# Patient Record
Sex: Male | Born: 1950 | Race: White | Hispanic: No | Marital: Married | Smoking: Former smoker
Health system: Southern US, Community
[De-identification: ages and names within clinical notes are randomized; demographics above are authoritative.]

## PROBLEM LIST (undated history)

## (undated) DIAGNOSIS — C801 Malignant (primary) neoplasm, unspecified: Secondary | ICD-10-CM

## (undated) DIAGNOSIS — I10 Essential (primary) hypertension: Secondary | ICD-10-CM

## (undated) DIAGNOSIS — M549 Dorsalgia, unspecified: Secondary | ICD-10-CM

## (undated) HISTORY — PX: BACK SURGERY: SHX140

---

## 1978-09-17 DIAGNOSIS — C801 Malignant (primary) neoplasm, unspecified: Secondary | ICD-10-CM

## 1978-09-17 HISTORY — DX: Malignant (primary) neoplasm, unspecified: C80.1

## 2017-01-15 ENCOUNTER — Encounter (HOSPITAL_COMMUNITY): Payer: Self-pay | Admitting: Emergency Medicine

## 2017-01-15 ENCOUNTER — Inpatient Hospital Stay (HOSPITAL_COMMUNITY)
Admission: EM | Admit: 2017-01-15 | Discharge: 2017-01-16 | DRG: 066 | Disposition: A | Discharge status: Home / Self Care | Payer: Medicare Other | Attending: Internal Medicine | Admitting: Internal Medicine

## 2017-01-15 ENCOUNTER — Inpatient Hospital Stay (HOSPITAL_COMMUNITY): Payer: Medicare Other

## 2017-01-15 ENCOUNTER — Emergency Department (HOSPITAL_COMMUNITY): Payer: Medicare Other

## 2017-01-15 DIAGNOSIS — Z8551 Personal history of malignant neoplasm of bladder: Secondary | ICD-10-CM | POA: Diagnosis not present

## 2017-01-15 DIAGNOSIS — I10 Essential (primary) hypertension: Secondary | ICD-10-CM | POA: Diagnosis present

## 2017-01-15 DIAGNOSIS — R29810 Facial weakness: Secondary | ICD-10-CM | POA: Diagnosis present

## 2017-01-15 DIAGNOSIS — Z806 Family history of leukemia: Secondary | ICD-10-CM | POA: Diagnosis not present

## 2017-01-15 DIAGNOSIS — Z7982 Long term (current) use of aspirin: Secondary | ICD-10-CM | POA: Diagnosis not present

## 2017-01-15 DIAGNOSIS — Z87891 Personal history of nicotine dependence: Secondary | ICD-10-CM | POA: Diagnosis not present

## 2017-01-15 DIAGNOSIS — E785 Hyperlipidemia, unspecified: Secondary | ICD-10-CM | POA: Diagnosis present

## 2017-01-15 DIAGNOSIS — R531 Weakness: Secondary | ICD-10-CM

## 2017-01-15 DIAGNOSIS — H532 Diplopia: Secondary | ICD-10-CM | POA: Diagnosis present

## 2017-01-15 DIAGNOSIS — D696 Thrombocytopenia, unspecified: Secondary | ICD-10-CM | POA: Diagnosis present

## 2017-01-15 DIAGNOSIS — R4781 Slurred speech: Secondary | ICD-10-CM

## 2017-01-15 DIAGNOSIS — G8929 Other chronic pain: Secondary | ICD-10-CM | POA: Diagnosis present

## 2017-01-15 DIAGNOSIS — G459 Transient cerebral ischemic attack, unspecified: Secondary | ICD-10-CM

## 2017-01-15 DIAGNOSIS — H4901 Third [oculomotor] nerve palsy, right eye: Secondary | ICD-10-CM | POA: Diagnosis present

## 2017-01-15 DIAGNOSIS — I639 Cerebral infarction, unspecified: Principal | ICD-10-CM

## 2017-01-15 DIAGNOSIS — G894 Chronic pain syndrome: Secondary | ICD-10-CM

## 2017-01-15 DIAGNOSIS — M545 Low back pain: Secondary | ICD-10-CM | POA: Diagnosis present

## 2017-01-15 DIAGNOSIS — Z6829 Body mass index (BMI) 29.0-29.9, adult: Secondary | ICD-10-CM

## 2017-01-15 DIAGNOSIS — Z823 Family history of stroke: Secondary | ICD-10-CM

## 2017-01-15 DIAGNOSIS — E669 Obesity, unspecified: Secondary | ICD-10-CM | POA: Diagnosis present

## 2017-01-15 DIAGNOSIS — Z801 Family history of malignant neoplasm of trachea, bronchus and lung: Secondary | ICD-10-CM

## 2017-01-15 DIAGNOSIS — D571 Sickle-cell disease without crisis: Secondary | ICD-10-CM

## 2017-01-15 HISTORY — DX: Malignant (primary) neoplasm, unspecified: C80.1

## 2017-01-15 HISTORY — DX: Dorsalgia, unspecified: M54.9

## 2017-01-15 HISTORY — DX: Essential (primary) hypertension: I10

## 2017-01-15 LAB — I-STAT CHEM 8, ED
BUN: 10 mg/dL (ref 6–20)
CREATININE: 1 mg/dL (ref 0.61–1.24)
Calcium, Ion: 1.09 mmol/L — ABNORMAL LOW (ref 1.15–1.40)
Chloride: 103 mmol/L (ref 101–111)
GLUCOSE: 83 mg/dL (ref 65–99)
HCT: 41 % (ref 39.0–52.0)
HEMOGLOBIN: 13.9 g/dL (ref 13.0–17.0)
Potassium: 4 mmol/L (ref 3.5–5.1)
Sodium: 140 mmol/L (ref 135–145)
TCO2: 29 mmol/L (ref 0–100)

## 2017-01-15 LAB — CBC
HEMATOCRIT: 39.5 % (ref 39.0–52.0)
HEMOGLOBIN: 12.8 g/dL — AB (ref 13.0–17.0)
MCH: 22.4 pg — AB (ref 26.0–34.0)
MCHC: 32.4 g/dL (ref 30.0–36.0)
MCV: 69.2 fL — AB (ref 78.0–100.0)
Platelets: 131 10*3/uL — ABNORMAL LOW (ref 150–400)
RBC: 5.71 MIL/uL (ref 4.22–5.81)
RDW: 15.7 % — AB (ref 11.5–15.5)
WBC: 6 10*3/uL (ref 4.0–10.5)

## 2017-01-15 LAB — DIFFERENTIAL
BASOS PCT: 0 %
Basophils Absolute: 0 10*3/uL (ref 0.0–0.1)
EOS PCT: 2 %
Eosinophils Absolute: 0.1 10*3/uL (ref 0.0–0.7)
Lymphocytes Relative: 28 %
Lymphs Abs: 1.7 10*3/uL (ref 0.7–4.0)
MONO ABS: 0.4 10*3/uL (ref 0.1–1.0)
Monocytes Relative: 7 %
Neutro Abs: 3.8 10*3/uL (ref 1.7–7.7)
Neutrophils Relative %: 63 %

## 2017-01-15 LAB — I-STAT TROPONIN, ED: TROPONIN I, POC: 0 ng/mL (ref 0.00–0.08)

## 2017-01-15 LAB — COMPREHENSIVE METABOLIC PANEL
ALT: 19 U/L (ref 17–63)
AST: 24 U/L (ref 15–41)
Albumin: 3.8 g/dL (ref 3.5–5.0)
Alkaline Phosphatase: 41 U/L (ref 38–126)
Anion gap: 9 (ref 5–15)
BILIRUBIN TOTAL: 1.3 mg/dL — AB (ref 0.3–1.2)
BUN: 9 mg/dL (ref 6–20)
CO2: 24 mmol/L (ref 22–32)
CREATININE: 1.05 mg/dL (ref 0.61–1.24)
Calcium: 9.1 mg/dL (ref 8.9–10.3)
Chloride: 106 mmol/L (ref 101–111)
GFR calc Af Amer: >60 mL/min (ref >60)
Glucose, Bld: 81 mg/dL (ref 65–99)
POTASSIUM: 3.6 mmol/L (ref 3.5–5.1)
Sodium: 139 mmol/L (ref 135–145)
TOTAL PROTEIN: 6.5 g/dL (ref 6.5–8.1)

## 2017-01-15 LAB — APTT: aPTT: 27 seconds (ref 24–36)

## 2017-01-15 LAB — PROTIME-INR
INR: 1.04
Prothrombin Time: 13.6 seconds (ref 11.4–15.2)

## 2017-01-15 MED ORDER — ASPIRIN 81 MG PO CHEW: 324.0000 mg | CHEWABLE_TABLET | Freq: Once | ORAL | Status: DC

## 2017-01-15 MED ORDER — ASPIRIN 300 MG RE SUPP
300.0000 mg | Freq: Every day | RECTAL | Status: DC
  Administered 2017-01-15: 300 mg via RECTAL
  Filled 2017-01-15: qty 1

## 2017-01-15 MED ORDER — ALPRAZOLAM 0.25 MG PO TABS
0.2500 mg | ORAL_TABLET | Freq: Every day | ORAL | Status: DC
  Administered 2017-01-15: 0.25 mg via ORAL
  Filled 2017-01-15: qty 1

## 2017-01-15 MED ORDER — GADOBENATE DIMEGLUMINE 529 MG/ML IV SOLN
20.0000 mL | Freq: Once | INTRAVENOUS | Status: AC
  Administered 2017-01-15: 20 mL via INTRAVENOUS

## 2017-01-15 MED ORDER — TEMAZEPAM 7.5 MG PO CAPS
30.0000 mg | ORAL_CAPSULE | Freq: Every day | ORAL | Status: DC
  Administered 2017-01-15: 30 mg via ORAL
  Filled 2017-01-15: qty 4

## 2017-01-15 MED ORDER — OXYCODONE HCL 5 MG PO TABS
30.0000 mg | ORAL_TABLET | Freq: Four times a day (QID) | ORAL | Status: DC | PRN
  Administered 2017-01-16 (×2): 30 mg via ORAL
  Filled 2017-01-15 (×2): qty 6

## 2017-01-15 MED ORDER — ASPIRIN 325 MG PO TABS
325.0000 mg | ORAL_TABLET | Freq: Every day | ORAL | Status: DC
  Administered 2017-01-16: 325 mg via ORAL
  Filled 2017-01-15: qty 1

## 2017-01-15 MED ORDER — STROKE: EARLY STAGES OF RECOVERY BOOK
Freq: Once | Status: AC
  Administered 2017-01-16: 06:00:00
  Filled 2017-01-15: qty 1

## 2017-01-15 MED ORDER — OXYCODONE HCL ER 20 MG PO T12A
40.0000 mg | EXTENDED_RELEASE_TABLET | Freq: Two times a day (BID) | ORAL | Status: DC
  Administered 2017-01-15 – 2017-01-16 (×2): 40 mg via ORAL
  Filled 2017-01-15 (×2): qty 2

## 2017-01-15 MED ORDER — ESCITALOPRAM OXALATE 10 MG PO TABS
20.0000 mg | ORAL_TABLET | Freq: Two times a day (BID) | ORAL | Status: DC
Start: 2017-01-15 — End: 2017-01-16
  Administered 2017-01-15 – 2017-01-16 (×2): 20 mg via ORAL
  Filled 2017-01-15 (×2): qty 2

## 2017-01-15 NOTE — ED Triage Notes (Signed)
Pt arrived via [facility] c/o slurred speech, unsteady gait, R eye closed, L sided facial droop, per pt's spouse LSW 1600.  EMS reports slurred speech intermittent.  No unilateral weakness noted.  Pt AOx4,

## 2017-01-15 NOTE — ED Notes (Addendum)
Patient transported to MRI 

## 2017-01-15 NOTE — Consult Note (Signed)
Neurology Consult Note  Reason for Consultation: CODE STROKE  Requesting provider: Duffy Bruce, MD  CC: Double vision  HPI: This is a 68-yo RH man who presents to the [facility] ED after experiencing the acute onset of double vision this afternoon. History is obtained directly from the patient who is a good historian. His wife is present at the bedside and offers additional information as needed.   They report that they are visiting from out of town and have been in town for about the past week. Today they were in their hotel room preparing for their flight home tomorrow morning. At 1615, he experienced the abrupt onset of double vision. This resolves if he closes either eye. This was accompanied by some left facial droop. His wife reports that she initially thought that he was playing around because he would look at her with both eyes closed, and looked as if he was having difficulty lifting his eyelids. However, when symptoms persisted, she called 911. He was transported to the [facility] emergency department where a code stroke was activated after his arrival. I arrived to evaluate the patient in the [place]. At that time, he had evidence of a partial right third nerve palsy and a left facial droop, no other deficits. NIH stroke scale score was 2. He did not have any deficits involving his extremities. He reported resolution of his double vision while he was in the emergency room. Because of his minor deficits, the decision was made not to proceed with thrombolytic therapy.  The patient denies any associated headache. He has some chronic numbness and tingling in his right leg but says this is unchanged today. He has no symptoms in his arms or left leg. He denies any difficulty with walking. He has not had any difficulty talking or swallowing. He denies any numbness or tingling in his face. He feels like he has blurry vision as well as double vision. No vision loss, however. He denies vertigo,  nausea, or vomiting. He apparently experienced a fall about one week ago, no reported trauma with this fall.  His wife reports that he typically takes aspirin 81 mg daily. However, he has not been taking this for the past week while they have been here on vacation. Additionally, he takes Diovan for his blood pressure. However, he did not bring that with him so he has been taking a different medication which he takes for palpitations because his doctor told him that would also help to lower his blood pressure. She is unable to recall the name of the medication. He takes oxycodone for chronic back pain. He also has Xanax but says he does not like to take it because it makes him sleepy. He denies taking any excessive medications today.  Last known well: 1615 today NIHSS score: 2 mRS score: 0 tPA given?: No, minor deficits that have been improving   PMH:  1. Hypertension . Palpitations 3. Chronic low back pain 4. Chronic numbness right leg  PSH:  He reports prior history of back surgery.  Family history: Noncontributory  Social history: He is married and lives with his wife in [place]. They're visiting the area. He denies any tobacco he is having stop smoking in 1980. He drinks about 7 beers per week, no reported heavy use. He denies illicit drug use.  Allergies: None reported  ROS: As per HPI. A full 14-point review of systems was performed and is otherwise unremarkable.   PE:  BP (!) 161/67 (  BP Location: Left Arm)   Pulse (!) 53   Temp 98 F (36.7 C) (Oral)   Resp 10   Ht 5\' 11"  (1.803 m)   Wt 93.3 kg (205 lb 11 oz)   SpO2 94%   BMI 28.69 kg/m   General: WDWN, no acute distress. AAO x4. Speech notable for mild intermittent dysarthria. No aphasia. Follows commands briskly. Affect is bright with congruent mood. Comportment is normal.  HEENT: Normocephalic. Neck supple without LAD. MMM, OP clear. Dentition good. Sclerae anicteric. No conjunctival injection.  CV: Regular, no  murmur. Carotid pulses full and symmetric, no bruits. Distal pulses 2+ and symmetric.  Lungs: CTAB.  Abdomen: Soft, obese, non-distended, non-tender. Bowel sounds present x4.  Extremities: No C/C/E. Neuro:  CN: Pupils are equal and round. They are symmetrically reactive from 2-->1 mm. visual fields are full to confrontation. The right eye is deviated laterally and inferiorly at rest. He has poor movement of the right eye medially and vertically. He tends to keep both eyes closed, particularly the right one, but with encouragement he opens them symmetrically without any obvious ptosis. He initially reported binocular diplopia that this resolved on serial examinations. Facial sensation is intact to light touch. Face is notable for some drooping of the left side of the mouth, suggestive of a left central VII pattern of weakness. Hearing is intact to conversational voice. Palate elevates symmetrically and uvula is midline. Voice is normal in tone, pitch and quality. Bilateral SCM and trapezii are 5/5. Tongue is midline with normal bulk and mobility.  Motor: Normal bulk, tone, and strength. No tremor or other abnormal movements. No drift.  Sensation: Intact to light touch with the exception of some dysesthesias in the right leg which she reports are chronic. DTRs: 3+, symmetric. Toes downgoing bilaterally. 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 Coordination: Finger-to-nose and heel-to-shin are without dysmetria. Finger taps are normal in amplitude and speed, no decrement.    Labs:  Lab Results  Component Value Date   HGB 13.9 01/15/2017   HCT 41.0 01/15/2017   GLUCOSE 83 01/15/2017   NA 140 01/15/2017   K 4.0 01/15/2017   CL 103 01/15/2017   CREATININE 1.00 01/15/2017   BUN 10 01/15/2017   Troponin 0.00 CBC is notable for hemoglobin of 12.8, hematocrit 39.5, platelets 131K PTT 27 PT 13.6, INR 1.04  Imaging:  I have personally and independently reviewed the CT scan of the head without contrast  from today. I have also reviewed the interpreting radiologist's report and concur. This shows no obvious acute abnormality. There appears to be mild-to-moderate diffuse generalized atrophy. A mild burden of chronic small vessel ischemic changes seen in the bihemispheric white matter. Visualized intracranial vessels appear unremarkable with no evident hyperdensities.  Assessment and Plan:  1. Acute ischemic stroke: His presentation is concerning for possible stroke involving the right midbrain. Symptoms seem to be improving somewhat and the decision was made not to proceed with thrombolytic therapy. He will be admitted for further workup, including MRI brain, MRA of the head and neck, TTE, fasting lipids, and hemoglobin a1c. Further testing will be determined by results from these initial studies. Recommend antiplatelet therapy with aspirin 325 mg daily for secondary stroke prevention once cleared to take oral medications. He may benefit from the addition of a statin with goal LDL less than 70. Ensure adequate glucose control. Allow permissive hypertension in the acute phase, treating only SBP greater than 220 mmHg and/or DBP greater than 110 mmHg. Avoid fever and hyperglycemia  as these can extend the infarct and have been associated with forced neurological outcomes. Initiate rehab services. DVT prophylaxis as needed.   2. Right cranial nerve III palsy: This is incomplete and spares the pupil. This is acute, concerning for midbrain stroke as noted above. If diplopia recurs, recommend alternating eye patching for symptomatic treatment. This will likely be self-limited and anticipate improvement in eye movements over time. PT/OT as needed.  3. Facial droop: He appears to have a left facial droop, again concerning for midbrain stroke. This is mild. Follow.  4. Dysarthria: This is acute and mild, concerning for midbrain stroke as noted above. Recommend swallow evaluation before allowing oral intake.  This was  discussed with the patient and his wife. Education was provided on the diagnosis and expected evaluation and treatment. I have recommended admission and advised that they rescheduled there flights as he is not medically able to fly home tomorrow morning. They are in agreement with the plan as noted. They were given the opportunity to ask any questions and these were addressed to their satisfaction.   Thank you for this consultation. Neurology will continue to follow. The stroke team will assume care of the patient beginning 01/16/17. Please call with any questions or concerns.

## 2017-01-15 NOTE — ED Provider Notes (Signed)
[facility] DEPT Provider Note   CSN: [id number] Arrival date & time: 01/15/17  1733     History   Chief Complaint No chief complaint on file.   HPI Jaliel Deavers is a 66 y.o. male.  The history is provided by the patient and the EMS personnel.  Cerebrovascular Accident  This is a new problem. Episode onset: 1600. The problem occurs constantly. The problem has been gradually improving. Pertinent negatives include no chest pain, no abdominal pain, no headaches and no shortness of breath. Nothing aggravates the symptoms. Nothing relieves the symptoms. He has tried nothing for the symptoms.    No past medical history on file.  There are no active problems to display for this patient.   No past surgical history on file.   Home Medications    Prior to Admission medications   Not on File    Family History No family history on file.  Social History Social History  Substance Use Topics  . Smoking status: Not on file  . Smokeless tobacco: Not on file  . Alcohol use Not on file     Allergies   Patient has no allergy information on record.   Review of Systems Review of Systems  Constitutional: Negative for chills and fever.  HENT: Negative for ear pain and sore throat.   Eyes: Negative for pain and visual disturbance.  Respiratory: Negative for cough and shortness of breath.   Cardiovascular: Negative for chest pain and palpitations.  Gastrointestinal: Negative for abdominal pain and vomiting.  Genitourinary: Negative for dysuria and hematuria.  Musculoskeletal: Negative for arthralgias and back pain.  Skin: Negative for color change and rash.  Neurological: Positive for facial asymmetry, speech difficulty and weakness. Negative for seizures, syncope and headaches.  All other systems reviewed and are negative.    Physical Exam Updated Vital Signs Wt 93.3 kg   Physical Exam  Constitutional: He is oriented to person, place, and time. He appears  well-developed and well-nourished.  HENT:  Head: Normocephalic and atraumatic.  Eyes: Conjunctivae are normal.  Neck: Neck supple.  Cardiovascular: Regular rhythm.   No murmur heard. Pulmonary/Chest: Effort normal and breath sounds normal. No respiratory distress.  Abdominal: Soft. There is no tenderness.  Musculoskeletal: He exhibits no edema.  Neurological: He is alert and oriented to person, place, and time.  Left facial droop, mild slurring of speech, 4/5 grip strength in LUE, mild LUE drift, no focal sensory deficits, gait deferred  Skin: Skin is warm and dry.  Psychiatric: He has a normal mood and affect.  Nursing note and vitals reviewed.    ED Treatments / Results  Labs (all labs ordered are listed, but only abnormal results are displayed) Labs Reviewed  PROTIME-INR  APTT  CBC  DIFFERENTIAL  COMPREHENSIVE METABOLIC PANEL  I-STAT [place], ED  CBG MONITORING, ED  I-STAT CHEM 8, ED    EKG  EKG Interpretation None       Radiology No results found.  Procedures Procedures (including critical care time)  Medications Ordered in ED Medications - No data to display   Initial Impression / Assessment and Plan / ED Course  I have reviewed the triage vital signs and the nursing notes.  Pertinent labs & imaging results that were available during my care of the patient were reviewed by me and considered in my medical decision making (see chart for details).    Pt with presents with left facial droop, slurred speech, LUE weakness & tingling, and gait difficulty. LKN 1600;  Pt was driving and went on a hike with his wife this morning. CODE STROKE activated on arrival to the ED.  VS & exam as above. Labs & imaging ordered per protocol.  Neurology consulted and evaluated the Pt in the ED; "too good to treat" so recommending against tPA. Requesting admission to medicine for further evaluation and treatment.  Will admit the Pt to the Hospitalist's service for further  evaluation and management.  Final Clinical Impressions(s) / ED Diagnoses   Final diagnoses:  Left-sided weakness  Slurred speech    New Prescriptions New Prescriptions   No medications on file     Jenny Reichmann, MD 01/15/17 1829    Duffy Bruce, MD 01/17/17 1136

## 2017-01-15 NOTE — H&P (Signed)
History and Physical    Ihan Pat GYF:[id number] DOB: [birth year] DOA: 01/15/2017  PCP: Robert DelaGente - [facility] in [place], [place] Consultants:  Cardiology - Lorelei Pont; Cindee Lame - GI; Glade Stanford - neurosurgery Patient coming from: home in [place] -came to [place] for a family wedding; NOK: wife, [phone]   Chief Complaint: diplopia  HPI: Timothy Woods is a 66 y.o. male with medical history significant of remote bladder cancer, HTN, and chronic back pain presenting with concern for CVA.  He and his wife came to [facility] for a wedding last weekend; they arrived last Wednesday and were supposed to leave tomorrow.  This morning, they had breakfast and took a nice country drive to [facility].  Did not hike, just enjoyed the day and drove back.  Arrived back to their [facility], walked into the room and his eyes became asynchronous, unable to walk straight, slurred speech.  Acute onset of symptoms.  No facial droop according to wife, but EMS was concerned about R facial droop.  Has chronic right leg tingling due to back issues.  Noticed left arm and hand numbness and tingling.  Tongue and teeth were tingling.  Does not think that he had leg symptoms otherwise.  Symptoms occurred over about 10 minutes but they did not go away for >1 hour.  Developed left facial droop and right ptosis for a short period of time in the ER.  Symptoms recurred again after MRI.  At this time, symptoms appear to be completely resolved.  Of note, he forgot his Diovan/HCT and has not had it since last Wednesday.  He has been taking Metoprolol 1-2 tabs daily for palpitations and did not forget that.  He takes ASA most days at home but also forgot this on his trip and has not been taking it for almost a week.    With regards to his palpitations, he has had an echo and other evaluation for the problem and the cardiologist thought that it was stress-related.  He has not had any kind of prolonged monitoring  such as a Holter or event monitor.   ED Course: Code stroke activated, neurology saw the patient and did not recommend tPA  Review of Systems: As per HPI; otherwise review of systems reviewed and negative.   Ambulatory Status:  Ambulates without assistance  Past Medical History:  Diagnosis Date  . Back pain    4 bulging disks  . Cancer ([place]) 1980   bladder  . Hypertension     Past Surgical History:  Procedure Laterality Date  . BACK SURGERY      Social History   Social History  . Marital status: N/A    Spouse name: N/A  . Number of children: N/A  . Years of education: N/A   Occupational History  . retired Insurance claims handler    Social History Main Topics  . Smoking status: Former Smoker    Years: 35.00    Quit date: 01/16/1979  . Smokeless tobacco: Never Used  . Alcohol use 4.2 oz/week    7 Cans of beer per week     Comment: occasional  . Drug use: No     Comment: remote h/o marijuana  . Sexual activity: Not on file   Other Topics Concern  . Not on file   Social History Narrative  . No narrative on file    No Known Allergies  Family History  Problem Relation Age of Onset  . Lung cancer Mother 39  .  Leukemia Father 64  . CVA Maternal Aunt     x2    Prior to Admission medications   Medication Sig Start Date End Date Taking? Authorizing Provider  ALPRAZolam (XANAX) 0.25 MG tablet Take 0.25 mg by mouth at bedtime.   Yes Historical Provider, MD  aspirin EC 81 MG tablet Take 81 mg by mouth daily.   Yes Historical Provider, MD  escitalopram (LEXAPRO) 20 MG tablet Take 20 mg by mouth 2 (two) times daily.   Yes Historical Provider, MD  metoprolol (LOPRESSOR) 50 MG tablet Take 50 mg by mouth 2 (two) times daily.   Yes Historical Provider, MD  oxyCODONE (OXYCONTIN) 40 mg 12 hr tablet Take 40 mg by mouth 2 (two) times daily.   Yes Historical Provider, MD  oxycodone (ROXICODONE) 30 MG immediate release tablet Take 30 mg by mouth every 6 (six) hours as needed for  pain.   Yes Historical Provider, MD  temazepam (RESTORIL) 30 MG capsule Take 30 mg by mouth at bedtime.   Yes Historical Provider, MD  valsartan-hydrochlorothiazide (DIOVAN-HCT) 80-12.5 MG tablet Take 1 tablet by mouth daily.   Yes Historical Provider, MD    Physical Exam: Vitals:   01/15/17 1945 01/15/17 2030 01/15/17 2044 01/15/17 2105  BP: 135/65 (!) 164/76  (!) 160/63  Pulse:  (!) 54  (!) 51  Resp: 14   16  Temp:   98.1 F (36.7 C) 98.1 F (36.7 C)  TempSrc:    Oral  SpO2: 98% 99%  99%  Weight:    94.7 kg (208 lb 11.2 oz)  Height:    5\' 11"  (1.803 m)     General:  Appears calm and comfortable and is NAD Eyes:  PERRL, EOMI, normal lids, iris; he intermittently closes her right eye due to intermittent diplopia.  As long as he is looking in one direction, he does not have difficulty.  But when he turns his head and looks away, the diplopia returns. ENT:  grossly normal hearing, lips & tongue, mmm Neck:  no LAD, masses or thyromegaly Cardiovascular:  RRR, no m/r/g. No LE edema.  Respiratory:  CTA bilaterally, no w/r/r. Normal respiratory effort. Abdomen:  soft, ntnd, NABS Skin:  no rash or induration seen on limited exam Musculoskeletal:  grossly normal tone BUE/BLE, good ROM, no bony abnormality Psychiatric:  grossly normal mood and affect, speech fluent and appropriate, AOx3 Neurologic:  CN 2-12 grossly intact, moves all extremities in coordinated fashion, sensation intact.  Mild chronic RLE numbness which is unchanged.  Labs on Admission: I have personally reviewed following labs and imaging studies  CBC:  Recent Labs Lab 01/15/17 1738 01/15/17 1743  WBC 6.0  --   NEUTROABS 3.8  --   HGB 12.8* 13.9  HCT 39.5 41.0  MCV 69.2*  --   PLT 131*  --    Basic Metabolic Panel:  Recent Labs Lab 01/15/17 1738 01/15/17 1743  NA 139 140  K 3.6 4.0  CL 106 103  CO2 24  --   GLUCOSE 81 83  BUN 9 10  CREATININE 1.05 1.00  CALCIUM 9.1  --    GFR: Estimated Creatinine  Clearance: 84.3 mL/min (by C-G formula based on SCr of 1 mg/dL). Liver Function Tests:  Recent Labs Lab 01/15/17 1738  AST 24  ALT 19  ALKPHOS 41  BILITOT 1.3*  PROT 6.5  ALBUMIN 3.8   No results for input(s): LIPASE, AMYLASE in the last 168 hours. No results for input(s): AMMONIA in  the last 168 hours. Coagulation Profile:  Recent Labs Lab 01/15/17 1738  INR 1.04   Cardiac Enzymes: No results for input(s): CKTOTAL, CKMB, CKMBINDEX, TROPONINI in the last 168 hours. BNP (last 3 results) No results for input(s): PROBNP in the last 8760 hours. HbA1C: No results for input(s): HGBA1C in the last 72 hours. CBG: No results for input(s): GLUCAP in the last 168 hours. Lipid Profile: No results for input(s): CHOL, HDL, LDLCALC, TRIG, CHOLHDL, LDLDIRECT in the last 72 hours. Thyroid Function Tests: No results for input(s): TSH, T4TOTAL, FREET4, T3FREE, THYROIDAB in the last 72 hours. Anemia Panel: No results for input(s): VITAMINB12, FOLATE, FERRITIN, TIBC, IRON, RETICCTPCT in the last 72 hours. Urine analysis: No results found for: COLORURINE, APPEARANCEUR, LABSPEC, PHURINE, GLUCOSEU, HGBUR, BILIRUBINUR, KETONESUR, PROTEINUR, UROBILINOGEN, NITRITE, LEUKOCYTESUR  Creatinine Clearance: Estimated Creatinine Clearance: 84.3 mL/min (by C-G formula based on SCr of 1 mg/dL).  Sepsis Labs: @LABRCNTIP (procalcitonin:4,lacticidven:4) )No results found for this or any previous visit (from the past 240 hour(s)).   Radiological Exams on Admission: Mr [name] Neck W Wo Contrast  Result Date: 01/15/2017 CLINICAL DATA:  66 y/o M; slurred speech, unsteady gait, right eye close, left facial droop. EXAM: MR HEAD WITHOUT CONTRAST MRA HEAD WITHOUT CONTRAST MRA OF THE NECK WITHOUT AND WITH CONTRAST TECHNIQUE: Multiplanar, multiecho pulse sequences of the brain and surrounding structures were obtained without intravenous contrast. Angiographic images of the head were obtained using MRA technique without  intravenous contrast. Angiographic images of the neck were obtained using MRA technique without and with intravenous contrast. CONTRAST:  75mL MULTIHANCE GADOBENATE DIMEGLUMINE 529 MG/ML IV SOLN COMPARISON:  01/15/2017 CT of the head. FINDINGS: MR HEAD FINDINGS Brain: 4 mm focus of reduced diffusion within the superior midbrain posterior and superior to the right red nucleus compatible with acute infarction. No acute intracranial hemorrhage. Scattered punctate foci of T2 FLAIR hyperintensity in subcortical and periventricular white matter compatible with mild chronic microvascular ischemic changes. Mild brain parenchymal volume loss. No extra-axial collection, hydrocephalus, or effacement of basilar cisterns. Prominent retrocerebellar extra-axial space probably represents an arachnoid cyst or possibly a mega cisterna magna. Vascular: As below. Skull and upper cervical spine: Normal marrow signal. Sinuses/Orbits: The trace right mastoid effusion. Otherwise no abnormal signal of the paranasal sinuses. Orbits are unremarkable. Other: None. MR CIRCLE OF WILLIS FINDINGS Internal carotid arteries:  Patent. Anterior cerebral arteries:  Patent. Middle cerebral arteries: Patent. Anterior communicating artery: Patent. Posterior communicating arteries:  Patent. Posterior cerebral arteries:  Patent. Basilar artery:  Patent. Vertebral arteries:  Patent. No evidence of high-grade stenosis, large vessel occlusion, or aneurysm. MRA NECK FINDINGS Aortic arch: Patent.  Standard branching. Right common carotid artery: Patent. Right internal carotid artery: Patent. Right vertebral artery: Patent. Left common carotid artery: Patent. Left Internal carotid artery: Patent. Kinking and moderate tortuosity of the proximal internal carotid artery. Left Vertebral artery: Patent. There is no evidence of high-grade stenosis, dissection, or aneurysm unless noted above. IMPRESSION: 1. 4 mm acute infarction within the right superior midbrain  centered posterior and superior to the red nucleus. 2. Background of mild chronic microvascular ischemic changes and mild parenchymal volume loss of the brain. 3. Patent circle of Willis. No large vessel occlusion, aneurysm, or significant stenosis is identified. 4. Patent carotid and vertebral arteries. No large vessel occlusion, aneurysm, or significant stenosis is identified. These results will be called to the ordering clinician or representative by the Radiologist Assistant, and communication documented in the PACS or zVision Dashboard. Electronically Signed   By:  Kristine Garbe M.D.   On: 01/15/2017 20:53   Mr Brain Wo Contrast  Result Date: 01/15/2017 CLINICAL DATA:  66 y/o M; slurred speech, unsteady gait, right eye close, left facial droop. EXAM: MR HEAD WITHOUT CONTRAST MRA HEAD WITHOUT CONTRAST MRA OF THE NECK WITHOUT AND WITH CONTRAST TECHNIQUE: Multiplanar, multiecho pulse sequences of the brain and surrounding structures were obtained without intravenous contrast. Angiographic images of the head were obtained using MRA technique without intravenous contrast. Angiographic images of the neck were obtained using MRA technique without and with intravenous contrast. CONTRAST:  88mL MULTIHANCE GADOBENATE DIMEGLUMINE 529 MG/ML IV SOLN COMPARISON:  01/15/2017 CT of the head. FINDINGS: MR HEAD FINDINGS Brain: 4 mm focus of reduced diffusion within the superior midbrain posterior and superior to the right red nucleus compatible with acute infarction. No acute intracranial hemorrhage. Scattered punctate foci of T2 FLAIR hyperintensity in subcortical and periventricular white matter compatible with mild chronic microvascular ischemic changes. Mild brain parenchymal volume loss. No extra-axial collection, hydrocephalus, or effacement of basilar cisterns. Prominent retrocerebellar extra-axial space probably represents an arachnoid cyst or possibly a mega cisterna magna. Vascular: As below. Skull and  upper cervical spine: Normal marrow signal. Sinuses/Orbits: The trace right mastoid effusion. Otherwise no abnormal signal of the paranasal sinuses. Orbits are unremarkable. Other: None. MR CIRCLE OF WILLIS FINDINGS Internal carotid arteries:  Patent. Anterior cerebral arteries:  Patent. Middle cerebral arteries: Patent. Anterior communicating artery: Patent. Posterior communicating arteries:  Patent. Posterior cerebral arteries:  Patent. Basilar artery:  Patent. Vertebral arteries:  Patent. No evidence of high-grade stenosis, large vessel occlusion, or aneurysm. MRA NECK FINDINGS Aortic arch: Patent.  Standard branching. Right common carotid artery: Patent. Right internal carotid artery: Patent. Right vertebral artery: Patent. Left common carotid artery: Patent. Left Internal carotid artery: Patent. Kinking and moderate tortuosity of the proximal internal carotid artery. Left Vertebral artery: Patent. There is no evidence of high-grade stenosis, dissection, or aneurysm unless noted above. IMPRESSION: 1. 4 mm acute infarction within the right superior midbrain centered posterior and superior to the red nucleus. 2. Background of mild chronic microvascular ischemic changes and mild parenchymal volume loss of the brain. 3. Patent circle of Willis. No large vessel occlusion, aneurysm, or significant stenosis is identified. 4. Patent carotid and vertebral arteries. No large vessel occlusion, aneurysm, or significant stenosis is identified. These results will be called to the ordering clinician or representative by the Radiologist Assistant, and communication documented in the PACS or zVision Dashboard. Electronically Signed   By: Kristine Garbe M.D.   On: 01/15/2017 20:53   Mr [name] Headm  Result Date: 01/15/2017 CLINICAL DATA:  67 y/o M; slurred speech, unsteady gait, right eye close, left facial droop. EXAM: MR HEAD WITHOUT CONTRAST MRA HEAD WITHOUT CONTRAST MRA OF THE NECK WITHOUT AND WITH CONTRAST  TECHNIQUE: Multiplanar, multiecho pulse sequences of the brain and surrounding structures were obtained without intravenous contrast. Angiographic images of the head were obtained using MRA technique without intravenous contrast. Angiographic images of the neck were obtained using MRA technique without and with intravenous contrast. CONTRAST:  82mL MULTIHANCE GADOBENATE DIMEGLUMINE 529 MG/ML IV SOLN COMPARISON:  01/15/2017 CT of the head. FINDINGS: MR HEAD FINDINGS Brain: 4 mm focus of reduced diffusion within the superior midbrain posterior and superior to the right red nucleus compatible with acute infarction. No acute intracranial hemorrhage. Scattered punctate foci of T2 FLAIR hyperintensity in subcortical and periventricular white matter compatible with mild chronic microvascular ischemic changes. Mild brain parenchymal volume loss. No  extra-axial collection, hydrocephalus, or effacement of basilar cisterns. Prominent retrocerebellar extra-axial space probably represents an arachnoid cyst or possibly a mega cisterna magna. Vascular: As below. Skull and upper cervical spine: Normal marrow signal. Sinuses/Orbits: The trace right mastoid effusion. Otherwise no abnormal signal of the paranasal sinuses. Orbits are unremarkable. Other: None. MR CIRCLE OF WILLIS FINDINGS Internal carotid arteries:  Patent. Anterior cerebral arteries:  Patent. Middle cerebral arteries: Patent. Anterior communicating artery: Patent. Posterior communicating arteries:  Patent. Posterior cerebral arteries:  Patent. Basilar artery:  Patent. Vertebral arteries:  Patent. No evidence of high-grade stenosis, large vessel occlusion, or aneurysm. MRA NECK FINDINGS Aortic arch: Patent.  Standard branching. Right common carotid artery: Patent. Right internal carotid artery: Patent. Right vertebral artery: Patent. Left common carotid artery: Patent. Left Internal carotid artery: Patent. Kinking and moderate tortuosity of the proximal internal  carotid artery. Left Vertebral artery: Patent. There is no evidence of high-grade stenosis, dissection, or aneurysm unless noted above. IMPRESSION: 1. 4 mm acute infarction within the right superior midbrain centered posterior and superior to the red nucleus. 2. Background of mild chronic microvascular ischemic changes and mild parenchymal volume loss of the brain. 3. Patent circle of Willis. No large vessel occlusion, aneurysm, or significant stenosis is identified. 4. Patent carotid and vertebral arteries. No large vessel occlusion, aneurysm, or significant stenosis is identified. These results will be called to the ordering clinician or representative by the Radiologist Assistant, and communication documented in the PACS or zVision Dashboard. Electronically Signed   By: Kristine Garbe M.D.   On: 01/15/2017 20:53   Ct Head Code Stroke W/o Cm  Result Date: 01/15/2017 CLINICAL DATA:  Code stroke.  Slurred speech. EXAM: CT HEAD WITHOUT CONTRAST TECHNIQUE: Contiguous axial images were obtained from the base of the skull through the vertex without intravenous contrast. COMPARISON:  None. FINDINGS: Brain: No evidence of acute infarction, hemorrhage, hydrocephalus, extra-axial collection or mass effect. No evidence of prior infarct. Vascular: No hyperdense vessel or unexpected calcification. Skull: Normal. Negative for fracture or focal lesion. Sinuses/Orbits: No acute finding. Other: Text page with results sent at the time of interpretation on 01/15/2017 at 5:38 pm to Dr. Shon Hale. ASPECTS [place] Stroke Program Early CT Score) - Ganglionic level infarction (caudate, lentiform nuclei, internal capsule, insula, M1-M3 cortex): 7 - Supraganglionic infarction (M4-M6 cortex): 3 Total score (0-10 with 10 being normal):10 IMPRESSION: Negative head CT. ASPECTS is 10. Electronically Signed   By: Monte Fantasia M.D.   On: 01/15/2017 17:39    EKG: pending  Assessment/Plan Principal Problem:   CVA (cerebral  vascular accident) [facility]) Active Problems:   Essential hypertension   Chronic pain   Thrombocytopenia (HCC)   CVA -Symptoms of intermittent diplopia with slurred speech, facial droop and numbness/tingling of LUE concerning for TIA/CVA -MRI confirms 4 mm acute infarction within the right superior midbrain centered posterior and superior to the red nucleus. -Will admit for further CVA evaluation/treatment -Telemetry monitoring -Carotid dopplers -Echo -Risk stratification with FLP, A1c; will also check TSH and UDS -ASA daily -PT/OT/ST/Nutrition Consults -He has a h/o palpitations that were previously attributed to stress, but he has not had Holter/event monitoring.  The concern would be that given his acute stroke, he may have underlying paroxysmal afib.  EKG is pending.  Will monitor on telemetry.  Likely needs ongoing assessment since afib in this patient would obviate need for anticoagulation.  HTN -Allow permissive HTN -Treat BP only if >220/120, and then with goal of 15% reduction -Hold Lopressor and Diovan/HCTZ  and plan to restart in 48-72 hours  HLD -Check FLP -Will plan to start statin therapy if FLP is not at goal  Thrombocytopenia -Platelets 075 -Uncertain baseline -Will follow -If plt <100, will need to d/c Lovenox  Chronic pain -The  Controlled Substances Registry does not allow access to [facility] records -Will continue listed home medications at this time -UDS pending    DVT prophylaxis: Lovenox  Code Status: Full - confirmed with patient/family Family Communication: Wife present throughout evaluation Disposition Plan:  Home once clinically improved Consults called: Neurology; PT/OT/ST/Nutrition Admission status: Admit - It is my clinical opinion that admission to INPATIENT is reasonable and necessary because this patient will require at least 2 midnights in the hospital to treat this condition based on the medical complexity of the problems presented.  Given the  aforementioned information, the predictability of an adverse outcome is felt to be significant.    Karmen Bongo MD [facility]  If 7PM-7AM, please contact night-coverage [email or web address] Password [email or web address]  01/15/2017, 11:30 PM

## 2017-01-16 ENCOUNTER — Inpatient Hospital Stay (HOSPITAL_COMMUNITY): Payer: Medicare Other

## 2017-01-16 DIAGNOSIS — I639 Cerebral infarction, unspecified: Secondary | ICD-10-CM | POA: Diagnosis not present

## 2017-01-16 DIAGNOSIS — I6300 Cerebral infarction due to thrombosis of unspecified precerebral artery: Secondary | ICD-10-CM

## 2017-01-16 DIAGNOSIS — R4781 Slurred speech: Secondary | ICD-10-CM

## 2017-01-16 DIAGNOSIS — I10 Essential (primary) hypertension: Secondary | ICD-10-CM | POA: Diagnosis not present

## 2017-01-16 DIAGNOSIS — I6789 Other cerebrovascular disease: Secondary | ICD-10-CM | POA: Diagnosis not present

## 2017-01-16 LAB — LIPID PANEL
Cholesterol: 156 mg/dL (ref 0–200)
HDL: 60 mg/dL (ref >40)
LDL CALC: 80 mg/dL (ref 0–99)
TRIGLYCERIDES: 81 mg/dL (ref <150)
Total CHOL/HDL Ratio: 2.6 RATIO
VLDL: 16 mg/dL (ref 0–40)

## 2017-01-16 LAB — RAPID URINE DRUG SCREEN, HOSP PERFORMED
Amphetamines: NOT DETECTED
Barbiturates: NOT DETECTED
Benzodiazepines: POSITIVE — AB
Cocaine: NOT DETECTED
Opiates: NOT DETECTED
Tetrahydrocannabinol: NOT DETECTED

## 2017-01-16 LAB — ECHOCARDIOGRAM COMPLETE
CHL CUP DOP CALC LVOT VTI: 30.1 cm
E decel time: 282 msec
E/e' ratio: 6.74
FS: 33 % (ref 28–44)
Height: 71 in
IV/PV OW: 1.1
LA diam index: 2.14 cm/m2
LASIZE: 46 mm
LAVOL: 130 mL
LAVOLA4C: 111 mL
LAVOLIN: 60.5 mL/m2
LDCA: 2.84 cm2
LEFT ATRIUM END SYS DIAM: 46 mm
LV E/e' medial: 6.74
LV E/e'average: 6.74
LV PW d: 12.6 mm — AB (ref 0.6–1.1)
LV TDI E'MEDIAL: 4.33
LVELAT: 12.6 cm/s
LVOT SV: 85 mL
LVOT diameter: 19 mm
LVOTPV: 106 cm/s
MV Dec: 282
MV pk E vel: 84.9 m/s
MVPG: 3 mmHg
MVPKAVEL: 59.7 m/s
RV LATERAL S' VELOCITY: 7.93 cm/s
Reg peak vel: 270 cm/s
TAPSE: 22.5 mm
TDI e' lateral: 12.6
TR max vel: 270 cm/s
Weight: 3339.2 oz

## 2017-01-16 LAB — TSH: TSH: 0.899 u[IU]/mL (ref 0.350–4.500)

## 2017-01-16 MED ORDER — BISACODYL 10 MG RE SUPP: 10.0000 mg | Freq: Every day | RECTAL | Status: DC | PRN

## 2017-01-16 MED ORDER — ACETAMINOPHEN 650 MG RE SUPP: 650.0000 mg | RECTAL | Status: DC | PRN

## 2017-01-16 MED ORDER — ATORVASTATIN CALCIUM 10 MG PO TABS
20.0000 mg | ORAL_TABLET | Freq: Every day | ORAL | Status: DC
  Administered 2017-01-16: 20 mg via ORAL
  Filled 2017-01-16: qty 2

## 2017-01-16 MED ORDER — ACETAMINOPHEN 325 MG PO TABS: 650.0000 mg | ORAL_TABLET | ORAL | Status: DC | PRN

## 2017-01-16 MED ORDER — FLEET ENEMA 7-19 GM/118ML RE ENEM
1.0000 | ENEMA | Freq: Once | RECTAL | Status: AC
  Administered 2017-01-16: 1 via RECTAL
  Filled 2017-01-16: qty 1

## 2017-01-16 MED ORDER — SODIUM CHLORIDE 0.9 % IV SOLN
INTRAVENOUS | Status: DC
  Administered 2017-01-16: 02:00:00 via INTRAVENOUS

## 2017-01-16 MED ORDER — ACETAMINOPHEN 160 MG/5ML PO SOLN: 650.0000 mg | ORAL | Status: DC | PRN

## 2017-01-16 MED ORDER — SENNOSIDES-DOCUSATE SODIUM 8.6-50 MG PO TABS: 1.0000 | ORAL_TABLET | Freq: Every evening | ORAL | Status: DC | PRN

## 2017-01-16 MED ORDER — KETOROLAC TROMETHAMINE 15 MG/ML IJ SOLN
15.0000 mg | Freq: Once | INTRAMUSCULAR | Status: AC
  Administered 2017-01-16: 15 mg via INTRAVENOUS
  Filled 2017-01-16: qty 1

## 2017-01-16 MED ORDER — ATORVASTATIN CALCIUM 20 MG PO TABS: 20.0000 mg | ORAL_TABLET | Freq: Every day | ORAL | 0 refills | Status: AC

## 2017-01-16 MED ORDER — ENOXAPARIN SODIUM 40 MG/0.4ML ~~LOC~~ SOLN: 40.0000 mg | SUBCUTANEOUS | Status: DC

## 2017-01-16 MED ORDER — ASPIRIN 325 MG PO TABS: 325.0000 mg | ORAL_TABLET | Freq: Every day | ORAL | 0 refills | Status: AC

## 2017-01-16 NOTE — Progress Notes (Signed)
Nutrition Brief Note  Received MD Consult from the stroke admission order set.  Wt Readings from Last 15 Encounters:  01/15/17 208 lb 11.2 oz (94.7 kg)    Body mass index is 29.11 kg/m. Patient meets criteria for overweight based on current BMI. No weight loss reported.   Lipid panel reviewed, WNL. Unable to visit patient as he was getting anxious with a lot of people in his room. RN reports no nutrition issues.   Current diet order is heart healthy, patient is consuming approximately 100% of meals at this time. Labs and medications reviewed.   No nutrition interventions warranted at this time. If nutrition issues arise, please consult RD.   Molli Barrows, RD, LDN, [place] Pager [phone] After Hours Pager [phone]

## 2017-01-16 NOTE — Discharge Summary (Signed)
Physician Discharge Summary  Timothy Woods UDJ:[id number] DOB: Jun 22, 1951 DOA: 01/15/2017  PCP: No primary care provider on file.  Admit date: 01/15/2017 Discharge date: 01/16/2017  Admitted From: Home Disposition: Home  Recommendations for Outpatient Follow-up:  Follow up with PCP in [place] in 1 week.  Home Health: None Equipment/Devices: None  Discharge Condition: Fair CODE STATUS: full code Diet recommendation: Heart healthy    Discharge Diagnoses:  Principal Problem:   Acute ischemic stroke [facility])   Active Problems:   CVA (cerebral vascular accident) ([place])   Essential hypertension   Chronic pain   Thrombocytopenia ([place])  Brief narrative/history of present illness Please refer to admission H&P for details, in brief, 66 year old male with history of remote bladder cancer, hypertension, remote smoker (quit over 20 years ago), chronic back pain who presented with acute onset of unsteady gait, slurred speech and diplopia. Patient and his wife were visiting from [place] for a wedding. While packing for the flight back home in his hotel room he had the above-mentioned symptoms. His diplopia would results when he closes either eye. His wife also noticed some left facial droop. Patient brought to ED where code stroke was activated. On evaluation he had partial right third nerve CL left facial droop but no other deficits. NIH stroke score was 2 and patient had resolution of his diplopia while in the ED. Given minor symptoms and resolution shortly patient did not receive thrombolytic therapy. He did not have any weakness of his arms or legs.  Head CT done in the ED was unremarkable. MRI of the brain showed 4 mm acute infarct within the right superior midbrain centered posterior and superior to the red nucleus. Also showed mild chronic microvascular ischemic changes. Carotid and vertebral arteries were patent without any occlusion. Patient admitted to hospitalist service for acute  ischemic stroke.   Hospital course Acute ischemic stroke -Risk factors include age, tobacco use history, hypertension, alcohol use, overweight and positive family history. Right brainstem infarct suspected secondary to small vessel disease with right third nerve pallor seen (facial weakness, diplopia and dysarthria which has resolved). CT head, MRI brain and MRA head finding as below. No carotid or vertebral disease or stenosis noted. -2-D echo shows EF of 55% with no wall motion abnormality or patent foramen ovale. -Carotid Doppler with nonspecific stenosis (1-39%) -Patient is on baby aspirin at home and has been placed on full dose aspirin and will be discharged on the same. LDL of 80. Started on Zocor 20 mg daily. -Hemoglobin A1c is pending.  His symptoms have resolved. Seen by physical therapy and speech pathology nurse. No further need. Patient counseled and provided resource on secondary stroke prevention including diet monitoring, medication adherence and regular exercise along with outpatient follow-up.  Hyperlipidemia LDL of 80. Added statin.  Hypertension Allow permissive blood pressure. Resume home blood pressure medication upon discharge.   Chronic back pain Continue home dose oxycodone.  History of palpitations Continue metoprolol.  Consults: Stroke team  Procedures: CT head MRI brain/head and neck 2-D echo Carotid ultrasound  Family communication: Wife at bedside.  Patient stable to be discharged home. May return back to [place] tomorrow.  Discharge Instructions   Allergies as of 01/16/2017   No Known Allergies     Medication List    STOP taking these medications   aspirin EC 81 MG tablet Replaced by:  aspirin 325 MG tablet     TAKE these medications   ALPRAZolam 0.25 MG tablet Commonly known as:  XANAX Take 0.25 mg by mouth at bedtime.   aspirin 325 MG tablet Take 1 tablet (325 mg total) by mouth daily. Start taking on:   01/17/2017 Replaces:  aspirin EC 81 MG tablet   atorvastatin 20 MG tablet Commonly known as:  LIPITOR Take 1 tablet (20 mg total) by mouth daily at 6 PM.   escitalopram 20 MG tablet Commonly known as:  LEXAPRO Take 20 mg by mouth 2 (two) times daily.   metoprolol 50 MG tablet Commonly known as:  LOPRESSOR Take 50 mg by mouth 2 (two) times daily.   oxycodone 30 MG immediate release tablet Commonly known as:  ROXICODONE Take 30 mg by mouth every 6 (six) hours as needed for pain.   OXYCONTIN 40 mg 12 hr tablet Generic drug:  oxyCODONE Take 40 mg by mouth 2 (two) times daily.   temazepam 30 MG capsule Commonly known as:  RESTORIL Take 30 mg by mouth at bedtime.   valsartan-hydrochlorothiazide 80-12.5 MG tablet Commonly known as:  DIOVAN-HCT Take 1 tablet by mouth daily.      Follow-up Information    MD in [place] in 1 week Follow up.          No Known Allergies    Procedures/Studies: Dg Chest 2 View  Result Date: 01/16/2017 CLINICAL DATA:  TIA symptoms yesterday. No current chest complaints. Former smoker. EXAM: CHEST  2 VIEW COMPARISON:  None in PACs FINDINGS: The lungs are well-expanded. The interstitial markings are coarse. There is no alveolar infiltrate or pleural effusion. There is no pneumothorax. The heart and pulmonary vascularity are normal. There is calcification in the wall of the aortic arch. The trachea is midline. There is old deformity of the anterior aspect of the left sixth rib. There is mild degenerative disc disease of the thoracic spine. IMPRESSION: Mild prominence of the pulmonary interstitium most compatible with smoking related changes. No pneumonia, CHF, nor other acute cardiopulmonary abnormality. Thoracic aortic atherosclerosis. Electronically Signed   By: Timothy  Woods M.D.   On: 01/16/2017 08:47   Mr Timothy Woods Neck W Wo Contrast  Result Date: 01/15/2017 CLINICAL DATA:  66 y/o M; slurred speech, unsteady gait, right eye close, left facial droop.  EXAM: MR HEAD WITHOUT CONTRAST MRA HEAD WITHOUT CONTRAST MRA OF THE NECK WITHOUT AND WITH CONTRAST TECHNIQUE: Multiplanar, multiecho pulse sequences of the brain and surrounding structures were obtained without intravenous contrast. Angiographic images of the head were obtained using MRA technique without intravenous contrast. Angiographic images of the neck were obtained using MRA technique without and with intravenous contrast. CONTRAST:  44mL MULTIHANCE GADOBENATE DIMEGLUMINE 529 MG/ML IV SOLN COMPARISON:  01/15/2017 CT of the head. FINDINGS: MR HEAD FINDINGS Brain: 4 mm focus of reduced diffusion within the superior midbrain posterior and superior to the right red nucleus compatible with acute infarction. No acute intracranial hemorrhage. Scattered punctate foci of T2 FLAIR hyperintensity in subcortical and periventricular white matter compatible with mild chronic microvascular ischemic changes. Mild brain parenchymal volume loss. No extra-axial collection, hydrocephalus, or effacement of basilar cisterns. Prominent retrocerebellar extra-axial space probably represents an arachnoid cyst or possibly a mega cisterna magna. Vascular: As below. Skull and upper cervical spine: Normal marrow signal. Sinuses/Orbits: The trace right mastoid effusion. Otherwise no abnormal signal of the paranasal sinuses. Orbits are unremarkable. Other: None. MR CIRCLE OF WILLIS FINDINGS Internal carotid arteries:  Patent. Anterior cerebral arteries:  Patent. Middle cerebral arteries: Patent. Anterior communicating artery: Patent. Posterior communicating arteries:  Patent. Posterior cerebral arteries:  Patent.  Basilar artery:  Patent. Vertebral arteries:  Patent. No evidence of high-grade stenosis, large vessel occlusion, or aneurysm. MRA NECK FINDINGS Aortic arch: Patent.  Standard branching. Right common carotid artery: Patent. Right internal carotid artery: Patent. Right vertebral artery: Patent. Left common carotid artery: Patent.  Left Internal carotid artery: Patent. Kinking and moderate tortuosity of the proximal internal carotid artery. Left Vertebral artery: Patent. There is no evidence of high-grade stenosis, dissection, or aneurysm unless noted above. IMPRESSION: 1. 4 mm acute infarction within the right superior midbrain centered posterior and superior to the red nucleus. 2. Background of mild chronic microvascular ischemic changes and mild parenchymal volume loss of the brain. 3. Patent circle of Willis. No large vessel occlusion, aneurysm, or significant stenosis is identified. 4. Patent carotid and vertebral arteries. No large vessel occlusion, aneurysm, or significant stenosis is identified. These results will be called to the ordering clinician or representative by the Radiologist Assistant, and communication documented in the PACS or zVision Dashboard. Electronically Signed   By: Kristine Garbe M.D.   On: 01/15/2017 20:53   Mr Brain Wo Contrast  Result Date: 01/15/2017 CLINICAL DATA:  66 y/o M; slurred speech, unsteady gait, right eye close, left facial droop. EXAM: MR HEAD WITHOUT CONTRAST MRA HEAD WITHOUT CONTRAST MRA OF THE NECK WITHOUT AND WITH CONTRAST TECHNIQUE: Multiplanar, multiecho pulse sequences of the brain and surrounding structures were obtained without intravenous contrast. Angiographic images of the head were obtained using MRA technique without intravenous contrast. Angiographic images of the neck were obtained using MRA technique without and with intravenous contrast. CONTRAST:  10mL MULTIHANCE GADOBENATE DIMEGLUMINE 529 MG/ML IV SOLN COMPARISON:  01/15/2017 CT of the head. FINDINGS: MR HEAD FINDINGS Brain: 4 mm focus of reduced diffusion within the superior midbrain posterior and superior to the right red nucleus compatible with acute infarction. No acute intracranial hemorrhage. Scattered punctate foci of T2 FLAIR hyperintensity in subcortical and periventricular white matter compatible with mild  chronic microvascular ischemic changes. Mild brain parenchymal volume loss. No extra-axial collection, hydrocephalus, or effacement of basilar cisterns. Prominent retrocerebellar extra-axial space probably represents an arachnoid cyst or possibly a mega cisterna magna. Vascular: As below. Skull and upper cervical spine: Normal marrow signal. Sinuses/Orbits: The trace right mastoid effusion. Otherwise no abnormal signal of the paranasal sinuses. Orbits are unremarkable. Other: None. MR CIRCLE OF WILLIS FINDINGS Internal carotid arteries:  Patent. Anterior cerebral arteries:  Patent. Middle cerebral arteries: Patent. Anterior communicating artery: Patent. Posterior communicating arteries:  Patent. Posterior cerebral arteries:  Patent. Basilar artery:  Patent. Vertebral arteries:  Patent. No evidence of high-grade stenosis, large vessel occlusion, or aneurysm. MRA NECK FINDINGS Aortic arch: Patent.  Standard branching. Right common carotid artery: Patent. Right internal carotid artery: Patent. Right vertebral artery: Patent. Left common carotid artery: Patent. Left Internal carotid artery: Patent. Kinking and moderate tortuosity of the proximal internal carotid artery. Left Vertebral artery: Patent. There is no evidence of high-grade stenosis, dissection, or aneurysm unless noted above. IMPRESSION: 1. 4 mm acute infarction within the right superior midbrain centered posterior and superior to the red nucleus. 2. Background of mild chronic microvascular ischemic changes and mild parenchymal volume loss of the brain. 3. Patent circle of Willis. No large vessel occlusion, aneurysm, or significant stenosis is identified. 4. Patent carotid and vertebral arteries. No large vessel occlusion, aneurysm, or significant stenosis is identified. These results will be called to the ordering clinician or representative by the Radiologist Assistant, and communication documented in the PACS or zVision Dashboard. Electronically  Signed    By: Kristine Garbe M.D.   On: 01/15/2017 20:53   Mr Timothy Woods Headm  Result Date: 01/15/2017 CLINICAL DATA:  66 y/o M; slurred speech, unsteady gait, right eye close, left facial droop. EXAM: MR HEAD WITHOUT CONTRAST MRA HEAD WITHOUT CONTRAST MRA OF THE NECK WITHOUT AND WITH CONTRAST TECHNIQUE: Multiplanar, multiecho pulse sequences of the brain and surrounding structures were obtained without intravenous contrast. Angiographic images of the head were obtained using MRA technique without intravenous contrast. Angiographic images of the neck were obtained using MRA technique without and with intravenous contrast. CONTRAST:  21mL MULTIHANCE GADOBENATE DIMEGLUMINE 529 MG/ML IV SOLN COMPARISON:  01/15/2017 CT of the head. FINDINGS: MR HEAD FINDINGS Brain: 4 mm focus of reduced diffusion within the superior midbrain posterior and superior to the right red nucleus compatible with acute infarction. No acute intracranial hemorrhage. Scattered punctate foci of T2 FLAIR hyperintensity in subcortical and periventricular white matter compatible with mild chronic microvascular ischemic changes. Mild brain parenchymal volume loss. No extra-axial collection, hydrocephalus, or effacement of basilar cisterns. Prominent retrocerebellar extra-axial space probably represents an arachnoid cyst or possibly a mega cisterna magna. Vascular: As below. Skull and upper cervical spine: Normal marrow signal. Sinuses/Orbits: The trace right mastoid effusion. Otherwise no abnormal signal of the paranasal sinuses. Orbits are unremarkable. Other: None. MR CIRCLE OF WILLIS FINDINGS Internal carotid arteries:  Patent. Anterior cerebral arteries:  Patent. Middle cerebral arteries: Patent. Anterior communicating artery: Patent. Posterior communicating arteries:  Patent. Posterior cerebral arteries:  Patent. Basilar artery:  Patent. Vertebral arteries:  Patent. No evidence of high-grade stenosis, large vessel occlusion, or aneurysm. MRA NECK  FINDINGS Aortic arch: Patent.  Standard branching. Right common carotid artery: Patent. Right internal carotid artery: Patent. Right vertebral artery: Patent. Left common carotid artery: Patent. Left Internal carotid artery: Patent. Kinking and moderate tortuosity of the proximal internal carotid artery. Left Vertebral artery: Patent. There is no evidence of high-grade stenosis, dissection, or aneurysm unless noted above. IMPRESSION: 1. 4 mm acute infarction within the right superior midbrain centered posterior and superior to the red nucleus. 2. Background of mild chronic microvascular ischemic changes and mild parenchymal volume loss of the brain. 3. Patent circle of Willis. No large vessel occlusion, aneurysm, or significant stenosis is identified. 4. Patent carotid and vertebral arteries. No large vessel occlusion, aneurysm, or significant stenosis is identified. These results will be called to the ordering clinician or representative by the Radiologist Assistant, and communication documented in the PACS or zVision Dashboard. Electronically Signed   By: Kristine Garbe M.D.   On: 01/15/2017 20:53   Ct Head Code Stroke W/o Cm  Result Date: 01/15/2017 CLINICAL DATA:  Code stroke.  Slurred speech. EXAM: CT HEAD WITHOUT CONTRAST TECHNIQUE: Contiguous axial images were obtained from the base of the skull through the vertex without intravenous contrast. COMPARISON:  None. FINDINGS: Brain: No evidence of acute infarction, hemorrhage, hydrocephalus, extra-axial collection or mass effect. No evidence of prior infarct. Vascular: No hyperdense vessel or unexpected calcification. Skull: Normal. Negative for fracture or focal lesion. Sinuses/Orbits: No acute finding. Other: Text page with results sent at the time of interpretation on 01/15/2017 at 5:38 pm to Dr. Shon Hale. ASPECTS [facility] Stroke Program Early CT Score) - Ganglionic level infarction (caudate, lentiform nuclei, internal capsule, insula, M1-M3 cortex): 7  - Supraganglionic infarction (M4-M6 cortex): 3 Total score (0-10 with 10 being normal):10 IMPRESSION: Negative head CT. ASPECTS is 10. Electronically Signed   By: Monte Fantasia M.D.   On: 01/15/2017  17:39    2-D echo Study Conclusions  - Left ventricle: The cavity size was normal. There was moderate   concentric hypertrophy. Systolic function was normal. The   estimated ejection fraction was 55%. Wall motion was normal;   there were no regional wall motion abnormalities. Left   ventricular diastolic function parameters were normal. - Mitral valve: There was mild regurgitation. - Left atrium: The atrium was moderately dilated. - Atrial septum: No defect or patent foramen ovale was identified.   Subjective: Denies further diplopia, slurred speech or facial droop.  Discharge Exam: Vitals:   01/16/17 0655 01/16/17 0903  BP: (!) 147/79 (!) 154/77  Pulse: (!) 50 (!) 56  Resp: 18 18  Temp: 98.4 F (36.9 C) 97.9 F (36.6 C)   Vitals:   01/16/17 0300 01/16/17 0500 01/16/17 0655 01/16/17 0903  BP: (!) 166/62 (!) 155/51 (!) 147/79 (!) 154/77  Pulse: 62 (!) 56 (!) 50 (!) 56  Resp: 18 18 18 18   Temp:   98.4 F (36.9 C) 97.9 F (36.6 C)  TempSrc:   Oral Oral  SpO2: 98% 95% 98% 98%  Weight:      Height:        General: Elderly male not in distress HEENT: Moist mucosa, supple neck, no facial droop Chest: Clear to auscultation bilaterally CVS: Normal S1 and S2, no murmurs or gallop GI: Soft, nondistended, nontender Musculoskeletal: Warm, no edema CNS: Alert and oriented, cranial nerves intact, normal motor power and tone, normal sensations and gait.    The results of significant diagnostics from this hospitalization (including imaging, microbiology, ancillary and laboratory) are listed below for reference.     Microbiology: No results found for this or any previous visit (from the past 240 hour(s)).   Labs: BNP (last 3 results) No results for input(s): BNP in the last  8760 hours. Basic Metabolic Panel:  Recent Labs Lab 01/15/17 1738 01/15/17 1743  NA 139 140  K 3.6 4.0  CL 106 103  CO2 24  --   GLUCOSE 81 83  BUN 9 10  CREATININE 1.05 1.00  CALCIUM 9.1  --    Liver Function Tests:  Recent Labs Lab 01/15/17 1738  AST 24  ALT 19  ALKPHOS 41  BILITOT 1.3*  PROT 6.5  ALBUMIN 3.8   No results for input(s): LIPASE, AMYLASE in the last 168 hours. No results for input(s): AMMONIA in the last 168 hours. CBC:  Recent Labs Lab 01/15/17 1738 01/15/17 1743  WBC 6.0  --   NEUTROABS 3.8  --   HGB 12.8* 13.9  HCT 39.5 41.0  MCV 69.2*  --   PLT 131*  --    Cardiac Enzymes: No results for input(s): CKTOTAL, CKMB, CKMBINDEX, TROPONINI in the last 168 hours. BNP: Invalid input(s): POCBNP CBG: No results for input(s): GLUCAP in the last 168 hours. D-Dimer No results for input(s): DDIMER in the last 72 hours. Hgb A1c No results for input(s): HGBA1C in the last 72 hours. Lipid Profile  Recent Labs  01/16/17 0423  CHOL 156  HDL 60  LDLCALC 80  TRIG 81  CHOLHDL 2.6   Thyroid function studies  Recent Labs  01/16/17 0423  TSH 0.899   Anemia work up No results for input(s): VITAMINB12, FOLATE, FERRITIN, TIBC, IRON, RETICCTPCT in the last 72 hours. Urinalysis No results found for: COLORURINE, APPEARANCEUR, LABSPEC, [place], GLUCOSEU, HGBUR, BILIRUBINUR, KETONESUR, PROTEINUR, UROBILINOGEN, NITRITE, LEUKOCYTESUR Sepsis Labs Invalid input(s): PROCALCITONIN,  WBC,  LACTICIDVEN Microbiology No results  found for this or any previous visit (from the past 240 hour(s)).   Time coordinating discharge: < 30 minutes  SIGNED:   Louellen Molder, MD  [facility] 01/16/2017, 4:43 PM Pager   If 7PM-7AM, please contact night-coverage [email or web address] Password [email or web address]

## 2017-01-16 NOTE — Discharge Instructions (Signed)
Stroke Prevention Some medical conditions and behaviors are associated with an increased chance of having a stroke. You may prevent a stroke by making healthy choices and managing medical conditions. How can I reduce my risk of having a stroke?  Stay physically active. Get at least 30 minutes of activity on most or all days.  Do not smoke. It may also be helpful to avoid exposure to secondhand smoke.  Limit alcohol use. Moderate alcohol use is considered to be:  No more than 2 drinks per day for men.  No more than 1 drink per day for nonpregnant women.  Eat healthy foods. This involves:  Eating 5 or more servings of fruits and vegetables a day.  Making dietary changes that address high blood pressure (hypertension), high cholesterol, diabetes, or obesity.  Manage your cholesterol levels.  Making food choices that are high in fiber and low in saturated fat, trans fat, and cholesterol may control cholesterol levels.  Take any prescribed medicines to control cholesterol as directed by your health care provider.  Manage your diabetes.  Controlling your carbohydrate and sugar intake is recommended to manage diabetes.  Take any prescribed medicines to control diabetes as directed by your health care provider.  Control your hypertension.  Making food choices that are low in salt (sodium), saturated fat, trans fat, and cholesterol is recommended to manage hypertension.  Ask your health care provider if you need treatment to lower your blood pressure. Take any prescribed medicines to control hypertension as directed by your health care provider.  If you are 18-39 years of age, have your blood pressure checked every 3-5 years. If you are 40 years of age or older, have your blood pressure checked every year.  Maintain a healthy weight.  Reducing calorie intake and making food choices that are low in sodium, saturated fat, trans fat, and cholesterol are recommended to manage  weight.  Stop drug abuse.  Avoid taking birth control pills.  Talk to your health care provider about the risks of taking birth control pills if you are over 35 years old, smoke, get migraines, or have ever had a blood clot.  Get evaluated for sleep disorders (sleep apnea).  Talk to your health care provider about getting a sleep evaluation if you snore a lot or have excessive sleepiness.  Take medicines only as directed by your health care provider.  For some people, aspirin or blood thinners (anticoagulants) are helpful in reducing the risk of forming abnormal blood clots that can lead to stroke. If you have the irregular heart rhythm of atrial fibrillation, you should be on a blood thinner unless there is a good reason you cannot take them.  Understand all your medicine instructions.  Make sure that other conditions (such as anemia or atherosclerosis) are addressed. Get help right away if:  You have sudden weakness or numbness of the face, arm, or leg, especially on one side of the body.  Your face or eyelid droops to one side.  You have sudden confusion.  You have trouble speaking (aphasia) or understanding.  You have sudden trouble seeing in one or both eyes.  You have sudden trouble walking.  You have dizziness.  You have a loss of balance or coordination.  You have a sudden, severe headache with no known cause.  You have new chest pain or an irregular heartbeat. Any of these symptoms may represent a serious problem that is an emergency. Do not wait to see if the symptoms will go away.   Get medical help at once. Call your local emergency services (911 in [place] Do not drive yourself to the hospital. This information is not intended to replace advice given to you by your health care provider. Make sure you discuss any questions you have with your health care provider. Document Released: 10/11/2004 Document Revised: 02/09/2016 Document Reviewed: 03/06/2013 Elsevier  Interactive Patient Education  2017 [employer]  

## 2017-01-16 NOTE — Progress Notes (Signed)
  Echocardiogram 2D Echocardiogram has been performed.  Donata Clay 01/16/2017, 3:58 PM

## 2017-01-16 NOTE — Evaluation (Signed)
Physical Therapy Evaluation Patient Details Name: Timothy Woods MRN: [id number] DOB: January 02, 1951 Today's Date: 01/16/2017   History of Present Illness  Pt is a 66 y/o male admitted with diplopia with L UE and hand numbness. Pt found to have a 49m acute infarct in the R superior midbrain centered posterior and superior to the red nucleus. PMH including but not limited to HTN, bladder cancer and chronic back pain.  Clinical Impression  Pt presented supine in bed with HOB elevated, awake and willing to participate in therapy session. Prior to admission, pt reported that he was independent with all functional mobility and ADLs. Pt able to ambulate in hallway with min guard and no AD, as well as participate in higher level balance assessment (DGI - see below for details). Pt presenting with some higher level balance deficits that would be best addressed in an outpatient setting when pt and his wife return home. No further acute PT needs identified at this time. PT signing off.    Follow Up Recommendations Outpatient PT    Equipment Recommendations  None recommended by PT    Recommendations for Other Services       Precautions / Restrictions Precautions Precautions: Fall Restrictions Weight Bearing Restrictions: No      Mobility  Bed Mobility               General bed mobility comments: pt sitting OOB in recliner chair when therapist entered  Transfers Overall transfer level: Needs assistance Equipment used: None Transfers: Sit to/from Stand Sit to Stand: Supervision         General transfer comment: supervision for safety  Ambulation/Gait Ambulation/Gait assistance: Min guard Ambulation Distance (Feet): 300 Feet Assistive device: None Gait Pattern/deviations: Step-through pattern;Drifts right/left Gait velocity: WFL Gait velocity interpretation: at or above normal speed for age/gender General Gait Details: mild instability but not LOB or need for physical assistance,  min guard for safety  Stairs            Wheelchair Mobility    Modified Rankin (Stroke Patients Only) Modified Rankin (Stroke Patients Only) Pre-Morbid Rankin Score: No symptoms Modified Rankin: No symptoms     Balance Overall balance assessment: Needs assistance Sitting-balance support: Feet supported Sitting balance-Leahy Scale: Good     Standing balance support: During functional activity;No upper extremity supported Standing balance-Leahy Scale: Fair                   Standardized Balance Assessment Standardized Balance Assessment : Dynamic Gait Index   Dynamic Gait Index Level Surface: Normal Change in Gait Speed: Normal Gait with Horizontal Head Turns: Mild Impairment Gait with Vertical Head Turns: Normal Gait and Pivot Turn: Moderate Impairment Step Over Obstacle: Normal Step Around Obstacles: Normal Steps: Mild Impairment Total Score: 20       Pertinent Vitals/Pain Pain Assessment: Faces Pain Score: 4  Faces Pain Scale: Hurts a little bit Pain Location: head Pain Descriptors / Indicators: Headache Pain Intervention(s): Monitored during session    Home Living Family/patient expects to be discharged to:: Private residence Living Arrangements: Spouse/significant other Available Help at Discharge: Family;Available 24 hours/day Type of Home: House Home Access: Stairs to enter Entrance Stairs-Rails: Right;Left;Can reach both Entrance Stairs-Number of Steps: 3 Home Layout: Two level Home Equipment: Other (comment) (walking sticks)      Prior Function Level of Independence: Independent               Hand Dominance   Dominant Hand: Right    Extremity/Trunk Assessment  Upper Extremity Assessment Upper Extremity Assessment: Defer to OT evaluation    Lower Extremity Assessment Lower Extremity Assessment: Overall WFL for tasks assessed    Cervical / Trunk Assessment Cervical / Trunk Assessment: Normal  Communication    Communication: No difficulties  Cognition Arousal/Alertness: Awake/alert Behavior During Therapy: WFL for tasks assessed/performed Overall Cognitive Status: Within Functional Limits for tasks assessed                                        General Comments      Exercises     Assessment/Plan    PT Assessment Patent does not need any further PT services;All further PT needs can be met in the next venue of care  PT Problem List Decreased balance;Decreased mobility;Decreased coordination       PT Treatment Interventions      PT Goals (Current goals can be found in the Care Plan section)  Acute Rehab PT Goals Patient Stated Goal: return home    Frequency     Barriers to discharge        Co-evaluation               AM-PAC PT "6 Clicks" Daily Activity  Outcome Measure Difficulty turning over in bed (including adjusting bedclothes, sheets and blankets)?: None Difficulty moving from lying on back to sitting on the side of the bed? : None Difficulty sitting down on and standing up from a chair with arms (e.g., wheelchair, bedside commode, etc,.)?: None Help needed moving to and from a bed to chair (including a wheelchair)?: None Help needed walking in hospital room?: A Little Help needed climbing 3-5 steps with a railing? : A Little 6 Click Score: 22    End of Session Equipment Utilized During Treatment: Gait belt Activity Tolerance: Patient tolerated treatment well Patient left: Other (comment);with nursing/sitter in room;with call bell/phone within reach (standing at bed) Nurse Communication: Mobility status PT Visit Diagnosis: Other abnormalities of gait and mobility (R26.89);Other symptoms and signs involving the nervous system (R29.898)    Time: 9574-7340 PT Time Calculation (min) (ACUTE ONLY): 17 min   Charges:   PT Evaluation $PT Eval Low Complexity: 1 Procedure     PT G Codes:        Sherie Don, PT, DPT [place] 01/16/2017, 11:14 AM

## 2017-01-16 NOTE — Progress Notes (Signed)
STROKE TEAM PROGRESS NOTE   SUBJECTIVE (INTERVAL HISTORY) He presented with transient difficulty focusing he denies true vertigo but states his balance was off and he was dizzy. He is feeling better and symptoms have resolved. MRI scan shows a small dorsal midbrain infarct likely from small vessel disease. He denies any prior history suggestive of stroke or TIA   OBJECTIVE Temp:  [97.9 F (36.6 C)-98.4 F (36.9 C)] 97.9 F (36.6 C) (05/02 0903) Pulse Rate:  [50-62] 56 (05/02 0903) Cardiac Rhythm: Normal sinus rhythm (05/02 0819) Resp:  [9-18] 18 (05/02 0903) BP: (135-166)/(51-83) 154/77 (05/02 0903) SpO2:  [94 %-99 %] 98 % (05/02 0903) Weight:  [93.3 kg (205 lb 11 oz)-94.7 kg (208 lb 11.2 oz)] 94.7 kg (208 lb 11.2 oz) (05/01 2105)  CBC:  Recent Labs Lab 01/15/17 1738 01/15/17 1743  WBC 6.0  --   NEUTROABS 3.8  --   HGB 12.8* 13.9  HCT 39.5 41.0  MCV 69.2*  --   PLT 131*  --     Basic Metabolic Panel:  Recent Labs Lab 01/15/17 1738 01/15/17 1743  NA 139 140  K 3.6 4.0  CL 106 103  CO2 24  --   GLUCOSE 81 83  BUN 9 10  CREATININE 1.05 1.00  CALCIUM 9.1  --    HgbA1c: No results found for: HGBA1C   PHYSICAL EXAM Pleasant middle-age male not in distress. . Afebrile. Head is nontraumatic. Neck is supple without bruit.    Cardiac exam no murmur or gallop. Lungs are clear to auscultation. Distal pulses are well felt. Neurological Exam ;  Awake  Alert oriented x 3. Normal speech and language.eye movements full without nystagmus.fundi were not visualized. Vision acuity and fields appear normal. Hearing is normal. Palatal movements are normal. Face symmetric. Tongue midline. Normal strength, tone, reflexes and coordination. Normal sensation. Gait deferred.  ASSESSMENT/PLAN [name] is a 66 y.o. male with history of bladder cancer, HTN, chronic back pain presenting with double vision and L facial droop. He did not receive IV t-PA due to minor deficits.    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 Aspects 10.    MRI  4 mm R superior midbrain infarcts. small vessel disease. Atrophy.   MRA head Unremarkable   MRA neck Unremarkable   2D Echo  pending   LDL 80  HgbA1c pending  Lovenox 40 mg sq daily for VTE prophylaxis  Diet Heart Room service appropriate? Yes; Fluid consistency: Thin   Typically takes aspirin 81 mg daily prior to admission but had not taken x 1 week because they were on vacation, now on aspirin 325 mg daily  Therapy recommendations:  Pending. Ok to be OOB. Orders adjusted  Disposition:  pending  (lives w/ wife, from [place], here on vacation)  Hypertension  Home Med: Diovan, but not taking past 1 week because he was on vacation  Stable Permissive hypertension (OK if < 220/120) but gradually normalize in 5-7 days Long-term BP goal normotensive  Hyperlipidemia  Home meds:  No statin  LDL 80 goal  Add statin  lipitor 20 mg  Continue statin at discharge  Other Stroke Risk Factors  Advanced age  ETOH use, level not checked   UDS positive for benzos  Overweight, Body mass index is 29.11 kg/m.  Other Active Problems  Thrombocytopenia, PLT 13.   Palpitations  Chronic low back pain on oxycodone  Hospital day # 1 I have personally examined this patient, reviewed notes, independently viewed imaging studies, participated in medical decision making and plan of care.ROS completed by me personally and pertinent positives fully documented  I have made any additions or clarifications directly to the above note. He presented with transient vision dysfunction and diplopia due to partial third nerve palsy from midbrain infarct from small vessel disease. Agree with aspirin for stroke prevention and aggressive risk factor modification. Long discussion with the  patient and wife regarding his stroke presentation, evaluation and treatment and prevention plan and answered questions. Greater than 50% time during this 25 minute visit was spent on counseling and coordination of care about his stroke. Follow-up as an outpatient in stroke clinic in 6 weeks discussed with Dr. Clementeen Graham M.D.  Antony Contras, MD Medical Director [place] Pager: [phone] 01/16/2017 3:13 PM   To contact Stroke Continuity provider, please refer to [email or web address] After hours, contact General Neurology

## 2017-01-16 NOTE — Progress Notes (Signed)
OT Cancellation Note  Patient Details Name: Timothy Woods MRN: [id number] DOB: November 27, 1950   Cancelled Treatment:    Reason Eval/Treat Not Completed: Patient not medically ready. Pt currently with conflicting activity orders (bed rest and activity as tolerated orders). OT will f/u with pt for evaluation when activity orders are updated and pt is appropriate for full participate in evaluation  Jaci Carrel 01/16/2017, 8:16 AM  Hulda Humphrey OTR/L [phone]

## 2017-01-16 NOTE — Progress Notes (Signed)
PT Cancellation Note  Patient Details Name: Timothy Woods MRN: [id number] DOB: 08/22/1951   Cancelled Treatment:    Reason Eval/Treat Not Completed: Medical issues which prohibited therapy. Pt currently with conflicting activity orders (bed rest and activity as tolerated orders). PT will f/u with pt for evaluation when activity orders are updated and pt is appropriate for full participate in evaluation.   [place] 01/16/2017, 8:10 AM

## 2017-01-16 NOTE — Progress Notes (Signed)
*  PRELIMINARY RESULTS* Vascular Ultrasound Carotid Duplex (Doppler) has been completed.  Preliminary findings: Findings consistent with a 1- 48 percent stenosis involving the right internal carotid artery and the left internal carotid artery. Left ICA was not completely imaged due to tortuosity and depth of vessel.  Everrett Coombe 01/16/2017, 2:48 PM

## 2017-01-16 NOTE — Evaluation (Addendum)
Occupational Therapy Evaluation and Discharge Patient Details Name: Timothy Woods MRN: [id number] DOB: 1951-05-27 Today's Date: 01/16/2017    History of Present Illness Pt is a 66 y/o male admitted with diplopia with L UE and hand numbness. Pt found to have a 34mm acute infarct in the R superior midbrain centered posterior and superior to the red nucleus. PMH including but not limited to HTN, bladder cancer and chronic back pain.   Clinical Impression   PTA Pt independent in ADL/IADL and mobility. Pt avid golfer and retired Dealer. Pt currently mod I/supervision for ADL and mobility. Pt's LUE WFL and per Pt report his arm is back to normal. Pt denies any current diplopia (please see vision section below). Pt with no questions or concerns at end of session. OT to sign off at this point, please feel free to re-order if other issues arise. Thank you for this referral.     Follow Up Recommendations  No OT follow up;Supervision - Intermittent    Equipment Recommendations  None recommended by OT    Recommendations for Other Services       Precautions / Restrictions Precautions Precautions: Fall Restrictions Weight Bearing Restrictions: No      Mobility Bed Mobility Overal bed mobility: Modified Independent                Transfers Overall transfer level: Needs assistance Equipment used: None Transfers: Sit to/from Stand Sit to Stand: Supervision         General transfer comment: supervision for safety    Balance Overall balance assessment: Needs assistance Sitting-balance support: Feet supported Sitting balance-Leahy Scale: Good     Standing balance support: During functional activity;No upper extremity supported Standing balance-Leahy Scale: Good                             ADL either performed or assessed with clinical judgement   ADL Overall ADL's : Modified independent                                       General ADL  Comments: Pt able to perform sink level grooming, LB dressing, walk in shower transfer and simulate toilet transfer without LOB or concerns     Vision Patient Visual Report: No change from baseline (Diplopia has resolved) Vision Assessment?: Yes;No apparent visual deficits Eye Alignment: Within Functional Limits Ocular Range of Motion: Within Functional Limits Alignment/Gaze Preference: Within Defined Limits Tracking/Visual Pursuits: Able to track stimulus in all quads without difficulty Saccades: Within functional limits Convergence: Within functional limits Visual Fields: No apparent deficits Additional Comments: Pt able to read from menu, find objects around the room, and was playing cards with wife when OT entered the room     Perception     Praxis      Pertinent Vitals/Pain Pain Assessment: Faces Faces Pain Scale: Hurts a little bit Pain Location: head Pain Descriptors / Indicators: Headache Pain Intervention(s): Monitored during session;Repositioned     Hand Dominance Right   Extremity/Trunk Assessment Upper Extremity Assessment Upper Extremity Assessment: Overall WFL for tasks assessed (Pt denies any tingling, able to perform finger dexterity ex)   Lower Extremity Assessment Lower Extremity Assessment: Defer to PT evaluation   Cervical / Trunk Assessment Cervical / Trunk Assessment: Normal   Communication Communication Communication: No difficulties   Cognition Arousal/Alertness: Awake/alert Behavior During Therapy: [facility] for  tasks assessed/performed Overall Cognitive Status: Within Functional Limits for tasks assessed                                     General Comments       Exercises     Shoulder Instructions      Home Living Family/patient expects to be discharged to:: Private residence Living Arrangements: Spouse/significant other Available Help at Discharge: Family;Available 24 hours/day Type of Home: House Home Access: Stairs to  enter [employer] of Steps: 3 Entrance Stairs-Rails: Right;Left;Can reach both Home Layout: Two level Alternate Level Stairs-Number of Steps: flight Alternate Level Stairs-Rails: Right;Left Bathroom Shower/Tub: Tub/shower unit;Walk-in shower   Bathroom Toilet: Standard     Home Equipment: Other (comment) (walking sticks)   Additional Comments: Pt is from [place] - down in [place] for a wedding  Lives With: Spouse    Prior Functioning/Environment Level of Independence: Independent        Comments: enjoys golf        OT Problem List: Impaired vision/perception;Impaired UE functional use      OT Treatment/Interventions:      OT Goals(Current goals can be found in the care plan section) Acute Rehab OT Goals Patient Stated Goal: return home OT Goal Formulation: With patient Time For Goal Achievement: 01/30/17 Potential to Achieve Goals: Good  OT Frequency:     Barriers to D/C:            Co-evaluation              AM-PAC PT "6 Clicks" Daily Activity     Outcome Measure Help from another person eating meals?: None Help from another person taking care of personal grooming?: None Help from another person toileting, which includes using toliet, bedpan, or urinal?: None Help from another person bathing (including washing, rinsing, drying)?: None Help from another person to put on and taking off regular upper body clothing?: None Help from another person to put on and taking off regular lower body clothing?: None 6 Click Score: 24   End of Session Equipment Utilized During Treatment: Gait belt Nurse Communication: Mobility status  Activity Tolerance: Patient tolerated treatment well Patient left: in chair;with call bell/phone within reach;with nursing/sitter in room  OT Visit Diagnosis: Unsteadiness on feet (R26.81);Other symptoms and signs involving the nervous system (R29.898)                Time: 4097-3532 OT Time Calculation (min): 23  min Charges:  OT General Charges $OT Visit: 1 Procedure OT Evaluation $OT Eval Low Complexity: 1 Procedure OT Treatments $Self Care/Home Management : 8-22 mins G-Codes:     Hulda Humphrey OTR/L [place] 02-06-17, 6:24 PM   Addendum: Late G Code Entry  Feb 06, 2017 1800  OT G-codes **NOT FOR INPATIENT CLASS**  Functional Assessment Tool Used AM-PAC 6 Clicks Daily Activity  Functional Limitation Self care  Self Care Current Status [phone]) [facility]  Self Care Goal Status (A8341) [facility]  Self Care Discharge Status [phone]) [facility]

## 2017-01-16 NOTE — Care Management Note (Signed)
Case Management Note  Patient Details  Name: Timothy Woods MRN: [id number] Date of Birth: 1951-06-07  Subjective/Objective:   Pt admitted with CVA. He is from [place] here in [place] for a wedding.  He lives with his spouse and was IADL. His PCP in [place] is Dr. Pamelia Hoit.                  Action/Plan: PT recommending outpt therapy. Awaiting OT recs. CM following for d/c needs, physician orders.   Expected Discharge Date:                  Expected Discharge Plan:  Home/Self Care  In-House Referral:     Discharge planning Services     Post Acute Care Choice:    Choice offered to:     DME Arranged:    DME Agency:     HH Arranged:    HH Agency:     Status of Service:  In process, will continue to follow  If discussed at Long Length of Stay Meetings, dates discussed:    Additional Comments:  Pollie Friar, RN 01/16/2017, 2:40 PM

## 2017-01-16 NOTE — Evaluation (Signed)
Speech Language Pathology Evaluation Patient Details Name: Timothy Woods MRN: [id number] DOB: 08/19/1951 Today's Date: 01/16/2017 Time: 7341-9379 SLP Time Calculation (min) (ACUTE ONLY): 20 min  Problem List:  Patient Active Problem List   Diagnosis Date Noted  . CVA (cerebral vascular accident) ([place]) 01/15/2017  . Essential hypertension 01/15/2017  . Chronic pain 01/15/2017  . Thrombocytopenia ([place]) 01/15/2017   Past Medical History:  Past Medical History:  Diagnosis Date  . Back pain    4 bulging disks  . Cancer ([place]) 1980   bladder  . Hypertension    Past Surgical History:  Past Surgical History:  Procedure Laterality Date  . BACK SURGERY     HPI:  65 year old male admitted 01/15/17 due to slurred speech, diplopia, altered gait, Left UE/LE weakness. PMH significant for bladder CA, HTN, chronic back pain. MRI revealed 22mm acute infarct - right superior midbrain   Assessment / Plan / Recommendation Clinical Impression  The Montreal Cognitive Assessment (MoCA) was administered. Pt scored 28/30 (N=26+/30). Points lost on delayed recall subtest, with pt recall of 2/5 unrelated words after 5 minute delay. Of note, pt and wife indicated prior to assessment that pt's memory was "not that great" at baseline. Pt was encouraged to use compensatory strategy of writing things down to facilitate functional recall. No further ST intervention is recommended at this time, however, pt was encouraged to contact PCP if he notices changes once home and back to normal routine, as outpatient ST may be beneficial.     SLP Assessment  SLP Recommendation/Assessment: Patient does not need any further Speech Language Pathology Services SLP Visit Diagnosis: Cognitive communication deficit (R41.841)    Follow Up Recommendations  None - unless concerns arise once pt returns to normal routine.        SLP Evaluation Cognition  Overall Cognitive Status: Within Functional Limits for tasks  assessed Arousal/Alertness: Awake/alert Orientation Level: Oriented X4 Attention: Focused;Sustained;Selective Focused Attention: Appears intact Sustained Attention: Appears intact Selective Attention: Appears intact Memory: Appears intact Awareness: Appears intact Problem Solving: Appears intact Executive Function: Reasoning;Sequencing;Organizing Reasoning: Appears intact Sequencing: Appears intact Organizing: Appears intact       Comprehension  Auditory Comprehension Overall Auditory Comprehension: Appears within functional limits for tasks assessed    Expression Expression Primary Mode of Expression: Verbal Verbal Expression Overall Verbal Expression: Appears within functional limits for tasks assessed Written Expression Dominant Hand: Right   Oral / Motor  Oral Motor/Sensory Function Overall Oral Motor/Sensory Function: Within functional limits Motor Speech Overall Motor Speech: Appears within functional limits for tasks assessed   Timothy Woods B. Quentin [facility], CCC-SLP [phone]         Timothy Woods 01/16/2017, 10:00 AM

## 2017-01-16 NOTE — Progress Notes (Signed)
Discharge instructions reviewed with patient/family. All questions answered at this time. RXs given. Pt transport home by family.   Ave Filter, RN

## 2017-01-17 LAB — HEMOGLOBIN A1C
HEMOGLOBIN A1C: 4.6 % — AB (ref 4.8–5.6)
Mean Plasma Glucose: 85 mg/dL

## 2017-01-18 LAB — VAS US CAROTID
LCCADDIAS: -13 cm/s
LEFT ECA DIAS: -12 cm/s
LEFT VERTEBRAL DIAS: -15 cm/s
LICADDIAS: -40 cm/s
LICADSYS: -145 cm/s
LICAPDIAS: -29 cm/s
Left CCA dist sys: -67 cm/s
Left CCA prox dias: 16 cm/s
Left CCA prox sys: 103 cm/s
Left ICA prox sys: -101 cm/s
RCCAPSYS: 71 cm/s
RIGHT ECA DIAS: -11 cm/s
RIGHT VERTEBRAL DIAS: 10 cm/s
Right CCA prox dias: 11 cm/s
Right cca dist sys: -103 cm/s

## 2017-04-10 NOTE — Progress Notes (Signed)
PT Progress Note Addendum for G-Codes    02-02-2017 1105  PT G-Codes **NOT FOR INPATIENT CLASS**  Functional Assessment Tool Used AM-PAC 6 Clicks Basic Mobility;Clinical judgement  Functional Limitation Mobility: Walking and moving around  Mobility: Walking and Moving Around Current Status (L2440) [facility]  Mobility: Walking and Moving Around Goal Status [phone]) [facility]  Mobility: Walking and Moving Around Discharge Status [phone]) [place], [place], DPT [phone]

## 2018-03-03 IMAGING — DX DG CHEST 2V
2 series · 2 of 2 positions shown · non-contrast
Comparison: None in PACs

CLINICAL DATA: TIA symptoms yesterday. No current chest complaints.
Former smoker.

EXAM:
CHEST  2 VIEW

[chest pa]
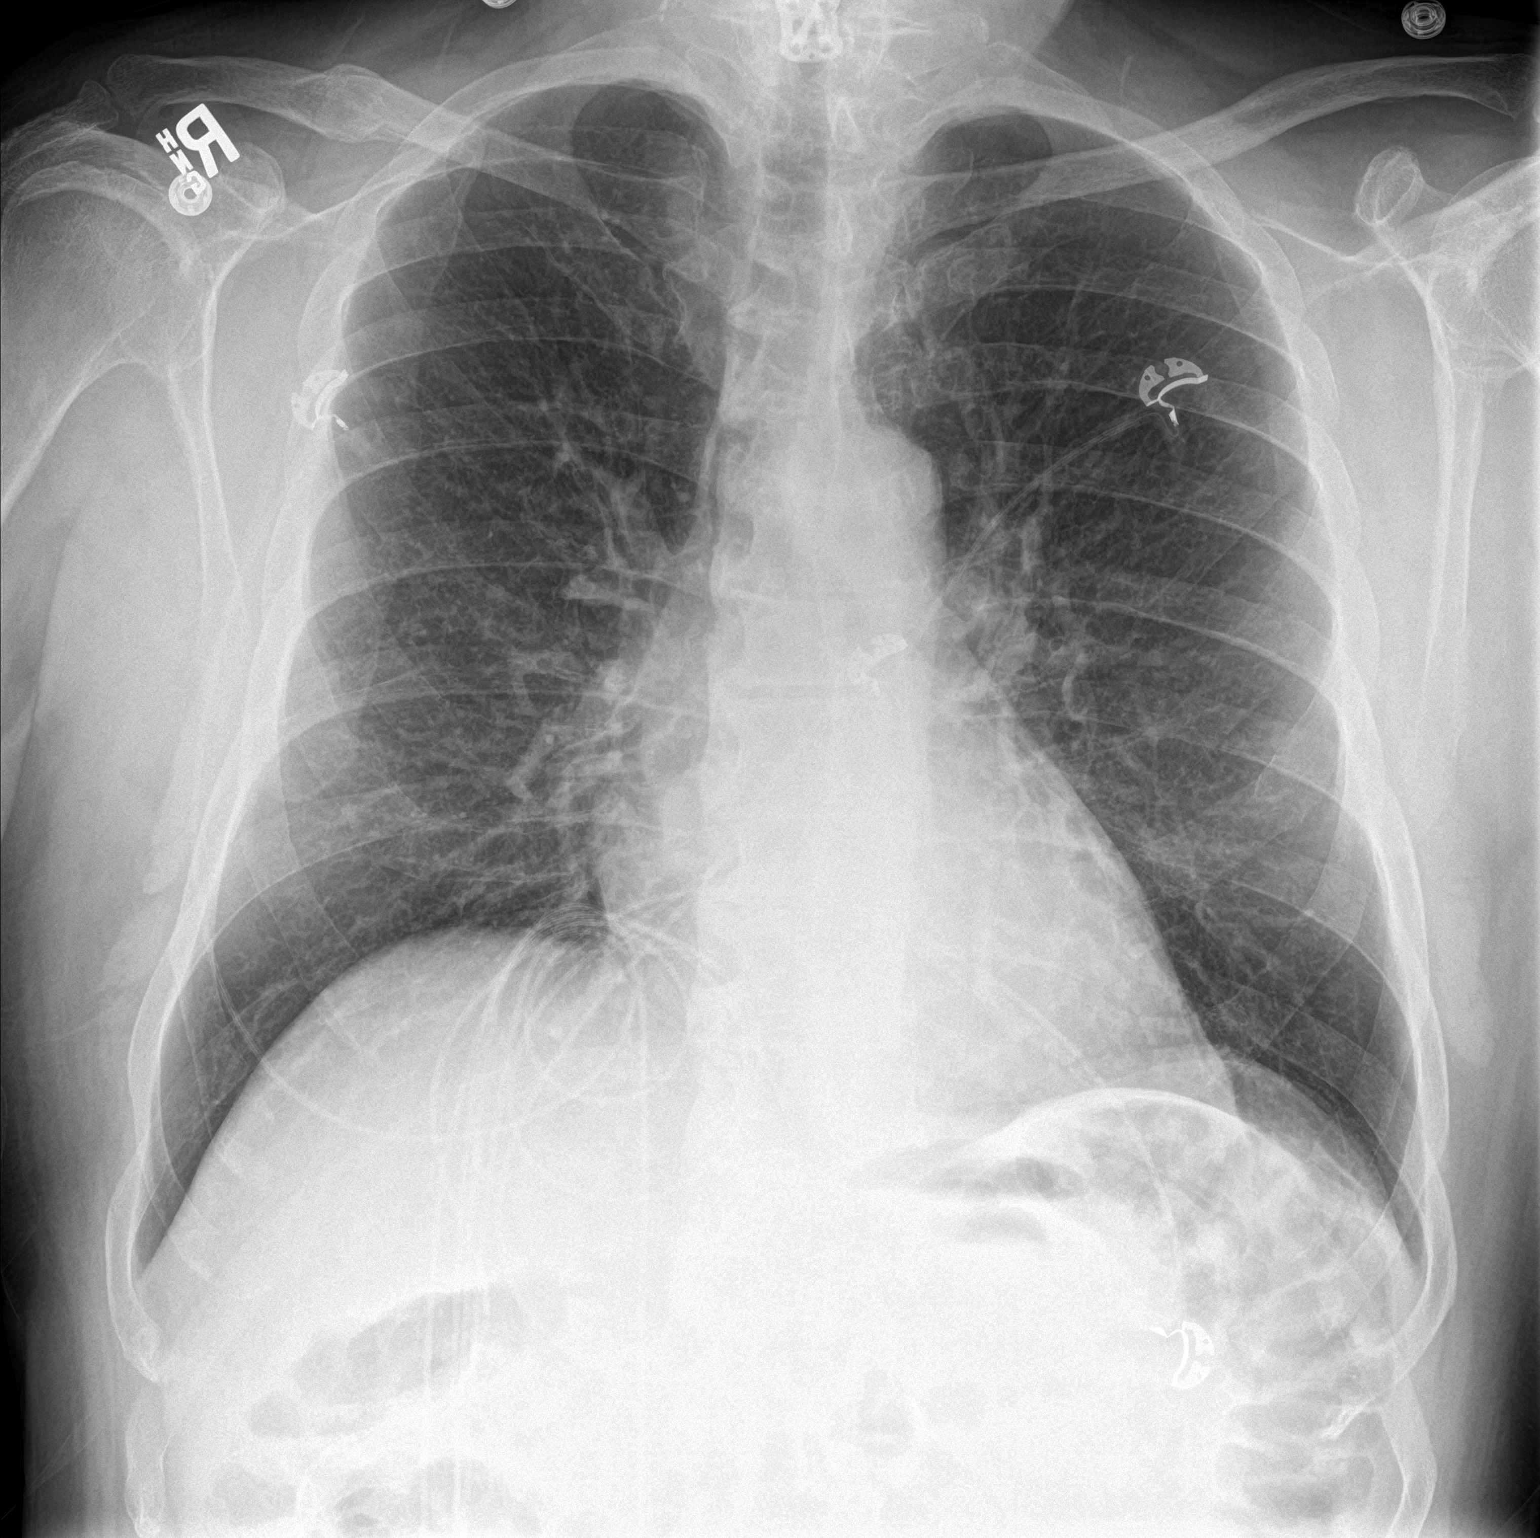

[chest lat]
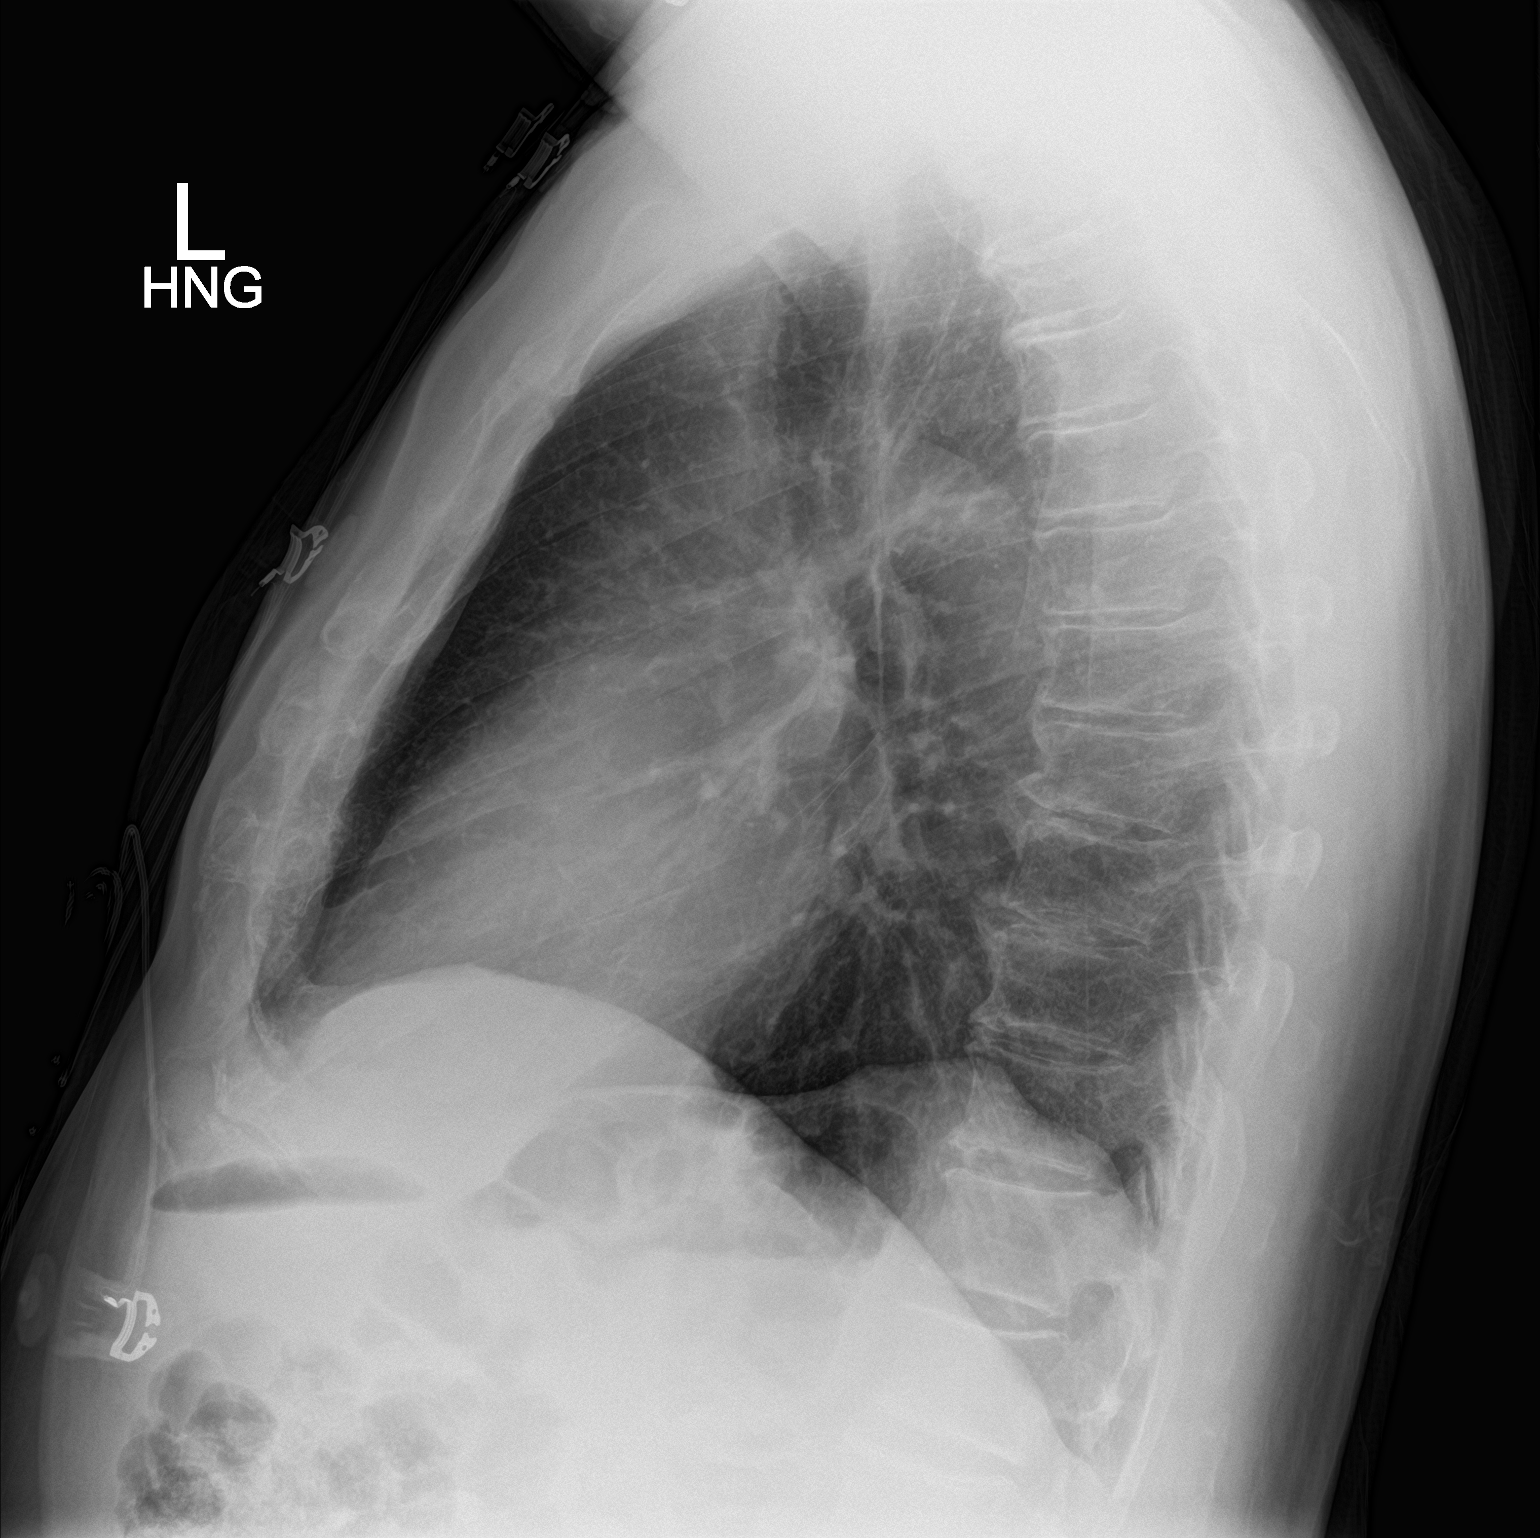

[2 of 2 positions shown; findings below may reference images not displayed]

FINDINGS: The lungs are well-expanded. The interstitial markings are coarse.
There is no alveolar infiltrate or pleural effusion. There is no
pneumothorax. The heart and pulmonary vascularity are normal. There
is calcification in the wall of the aortic arch. The trachea is
midline. There is old deformity of the anterior aspect of the left
sixth rib. There is mild degenerative disc disease of the thoracic
spine.
IMPRESSION: Mild prominence of the pulmonary interstitium most compatible with
smoking related changes. No pneumonia, CHF, nor other acute
cardiopulmonary abnormality.

Thoracic aortic atherosclerosis.
